# Patient Record
Sex: Male | Born: 1955 | Race: White | Hispanic: No | Marital: Married | Smoking: Former smoker
Health system: Southern US, Community
[De-identification: ages and names within clinical notes are randomized; demographics above are authoritative.]

## PROBLEM LIST (undated history)

## (undated) DIAGNOSIS — F410 Panic disorder [episodic paroxysmal anxiety] without agoraphobia: Secondary | ICD-10-CM

## (undated) DIAGNOSIS — A6920 Lyme disease, unspecified: Secondary | ICD-10-CM

## (undated) DIAGNOSIS — K4030 Unilateral inguinal hernia, with obstruction, without gangrene, not specified as recurrent: Secondary | ICD-10-CM

## (undated) DIAGNOSIS — J45909 Unspecified asthma, uncomplicated: Secondary | ICD-10-CM

## (undated) DIAGNOSIS — J449 Chronic obstructive pulmonary disease, unspecified: Secondary | ICD-10-CM

## (undated) DIAGNOSIS — T884XXA Failed or difficult intubation, initial encounter: Secondary | ICD-10-CM

## (undated) DIAGNOSIS — I509 Heart failure, unspecified: Secondary | ICD-10-CM

## (undated) DIAGNOSIS — E0590 Thyrotoxicosis, unspecified without thyrotoxic crisis or storm: Secondary | ICD-10-CM

## (undated) HISTORY — PX: CYST REMOVAL HAND: SHX6279

## (undated) HISTORY — PX: TONSILLECTOMY: SUR1361

---

## 1980-06-17 HISTORY — PX: HERNIA REPAIR: SHX51

## 1997-10-12 ENCOUNTER — Inpatient Hospital Stay (HOSPITAL_COMMUNITY): Admission: EM | Admit: 1997-10-12 | Discharge: 1997-10-15 | Payer: Self-pay | Admitting: Emergency Medicine

## 1997-10-17 ENCOUNTER — Ambulatory Visit (HOSPITAL_COMMUNITY): Admission: RE | Admit: 1997-10-17 | Discharge: 1997-10-17 | Payer: Self-pay | Admitting: Endocrinology

## 2001-05-26 ENCOUNTER — Encounter (INDEPENDENT_AMBULATORY_CARE_PROVIDER_SITE_OTHER): Payer: Self-pay | Admitting: *Deleted

## 2001-05-26 ENCOUNTER — Ambulatory Visit (HOSPITAL_COMMUNITY): Admission: RE | Admit: 2001-05-26 | Discharge: 2001-05-26 | Payer: Self-pay | Admitting: Gastroenterology

## 2003-09-16 ENCOUNTER — Emergency Department (HOSPITAL_COMMUNITY): Admission: EM | Admit: 2003-09-16 | Discharge: 2003-09-16 | Payer: Self-pay | Admitting: Emergency Medicine

## 2005-08-05 ENCOUNTER — Emergency Department (HOSPITAL_COMMUNITY): Admission: EM | Admit: 2005-08-05 | Discharge: 2005-08-05 | Payer: Self-pay | Admitting: Emergency Medicine

## 2005-10-08 ENCOUNTER — Ambulatory Visit (HOSPITAL_BASED_OUTPATIENT_CLINIC_OR_DEPARTMENT_OTHER): Admission: RE | Admit: 2005-10-08 | Discharge: 2005-10-08 | Payer: Self-pay | Admitting: Orthopaedic Surgery

## 2005-10-08 ENCOUNTER — Encounter (INDEPENDENT_AMBULATORY_CARE_PROVIDER_SITE_OTHER): Payer: Self-pay | Admitting: *Deleted

## 2007-03-30 ENCOUNTER — Emergency Department (HOSPITAL_COMMUNITY): Admission: EM | Admit: 2007-03-30 | Discharge: 2007-03-30 | Payer: Self-pay | Admitting: Emergency Medicine

## 2007-06-18 HISTORY — PX: ROTATOR CUFF REPAIR: SHX139

## 2008-12-02 ENCOUNTER — Emergency Department (HOSPITAL_COMMUNITY): Admission: EM | Admit: 2008-12-02 | Discharge: 2008-12-02 | Payer: Self-pay | Admitting: Emergency Medicine

## 2008-12-19 ENCOUNTER — Emergency Department (HOSPITAL_COMMUNITY): Admission: EM | Admit: 2008-12-19 | Discharge: 2008-12-19 | Payer: Self-pay | Admitting: Emergency Medicine

## 2010-05-09 ENCOUNTER — Encounter: Admission: RE | Admit: 2010-05-09 | Discharge: 2010-05-09 | Payer: Self-pay | Admitting: Orthopedic Surgery

## 2010-06-17 DIAGNOSIS — T884XXA Failed or difficult intubation, initial encounter: Secondary | ICD-10-CM

## 2010-06-17 HISTORY — DX: Failed or difficult intubation, initial encounter: T88.4XXA

## 2010-09-23 LAB — CBC
HCT: 47.6 % (ref 39.0–52.0)
Hemoglobin: 15.8 g/dL (ref 13.0–17.0)
MCHC: 33.1 g/dL (ref 30.0–36.0)
MCV: 92.9 fL (ref 78.0–100.0)
RBC: 5.12 MIL/uL (ref 4.22–5.81)
WBC: 7.3 10*3/uL (ref 4.0–10.5)

## 2010-09-23 LAB — COMPREHENSIVE METABOLIC PANEL
AST: 26 U/L (ref 0–37)
BUN: 6 mg/dL (ref 6–23)
CO2: 25 mEq/L (ref 19–32)
Calcium: 9.5 mg/dL (ref 8.4–10.5)
Chloride: 110 mEq/L (ref 96–112)
Creatinine, Ser: 0.85 mg/dL (ref 0.4–1.5)
GFR calc Af Amer: >60 mL/min (ref >60)
GFR calc non Af Amer: >60 mL/min (ref >60)
Glucose, Bld: 94 mg/dL (ref 70–99)
Total Bilirubin: 0.8 mg/dL (ref 0.3–1.2)

## 2010-09-23 LAB — DIFFERENTIAL
Basophils Absolute: 0 10*3/uL (ref 0.0–0.1)
Eosinophils Relative: 1 % (ref 0–5)
Lymphocytes Relative: 32 % (ref 12–46)
Lymphs Abs: 2.4 10*3/uL (ref 0.7–4.0)
Neutrophils Relative %: 58 % (ref 43–77)

## 2010-09-23 LAB — HEMOCCULT GUIAC POC 1CARD (OFFICE): Fecal Occult Bld: NEGATIVE

## 2010-09-24 LAB — COMPREHENSIVE METABOLIC PANEL
ALT: 23 U/L (ref 0–53)
Albumin: 4.2 g/dL (ref 3.5–5.2)
Alkaline Phosphatase: 44 U/L (ref 39–117)
Chloride: 107 mEq/L (ref 96–112)
Potassium: 3.7 mEq/L (ref 3.5–5.1)
Sodium: 139 mEq/L (ref 135–145)
Total Bilirubin: 1 mg/dL (ref 0.3–1.2)
Total Protein: 7.5 g/dL (ref 6.0–8.3)

## 2010-09-24 LAB — CBC
Hemoglobin: 15.7 g/dL (ref 13.0–17.0)
Platelets: 284 10*3/uL (ref 150–400)
RDW: 13.2 % (ref 11.5–15.5)
WBC: 8 10*3/uL (ref 4.0–10.5)

## 2010-09-24 LAB — DIFFERENTIAL
Basophils Relative: 0 % (ref 0–1)
Eosinophils Absolute: 0.1 10*3/uL (ref 0.0–0.7)
Eosinophils Relative: 2 % (ref 0–5)
Monocytes Absolute: 0.7 10*3/uL (ref 0.1–1.0)
Monocytes Relative: 8 % (ref 3–12)

## 2010-10-30 NOTE — Consult Note (Signed)
Michael Love, TRAUTMAN         ACCOUNT NO.:  [id number]   MEDICAL RECORD NO.:  [id number]          PATIENT TYPE:  EMS   LOCATION:  ED                           FACILITY:  [facility]   PHYSICIAN:  Corinna L. Lendell Caprice, MDDATE OF BIRTH:  15-Jul-1955   DATE OF CONSULTATION:  12/02/2008  DATE OF DISCHARGE:  12/02/2008                                 CONSULTATION   REASON FOR CONSULTATION:  Reported black stool and abdominal pain.   HISTORY OF PRESENT ILLNESS:  Michael Love is a 55 year old white male  who has had black tarry stools for the past few days on and off.  He  also has had epigastric pain, particularly after eating.  He has been  nauseated.  He has been able to keep down liquids but has not been  eating as much as usual.  He has a history of gastroesophageal reflux  disease and has been seen by Dr. Randa Evens previously and had an EGD and  colonoscopy.  He has a does not drink alcohol.  He has not been taking  NSAIDs.  He has had no fevers or chills.  No weight loss.  He had no  stool in the vault and was heme negative on exam in the office.  He was  sent to the emergency room for further workup.  The patient requests to  go home if able.   PAST MEDICAL HISTORY:  Gastroesophageal reflux disease.   SOCIAL HISTORY:  The patient does not drink, smoke or use drugs.   FAMILY HISTORY:  His mother had heart disease.   REVIEW OF SYSTEMS:  As above, otherwise negative.   PHYSICAL EXAMINATION:  Temperature is 98.1, blood pressure 132/83, pulse  75, respiratory rate 20, oxygen saturation 97% on room air.  GENERAL:  Patient is well-nourished, well-developed in no acute  distress.  HEENT:  Normocephalic/atraumatic.  Pupils equal, round, reactive to  light.  Sclerae nonicteric.  Moist mucous membranes.  NECK:  Supple, no lymphadenopathy.  LUNGS:  Clear to auscultation bilaterally without wheezes, rhonchi or  rales.  CARDIOVASCULAR:  Regular rate and rhythm without murmurs, gallops or  rubs.  ABDOMEN:  Normal bowel sounds, soft, nontender, nondistended.  GU:  Deferred.  RECTAL:  As above.  EXTREMITIES:  No clubbing, cyanosis or edema.  SKIN:  No rash or jaundice.   LABS:  CBC is completely normal, specifically hemoglobin of 15.  PT/PTT  normal.  Complete metabolic panel is normal, specifically BUN is 7.  Acute abdominal series negative.   IMPRESSIONS/RECOMMENDATIONS:  1. Reported black tarry stool, heme negative today and no anemia or      hypotension.  Because he has had postprandial pain I will check a      lipase but if that is normal he can go home with Prilosec 40 mg a      day, Phenergan as needed and Vicodin.  I have spoken with Dr. Evette Cristal      who agrees and the patient can follow up with Dr. Randa Evens and have      esophagogastroduodenoscopy or other next week.  He that is stable  and does not      need to be admitted unless he has pancreatitis.  Apparently he has      had some similar episodes in the past.  2. Nausea, abdominal pain, see above; possibly esophagitis, possibly      gastritis or biliary etiology:  He had an abdominal ultrasound in      2005 and that showed no gallstones.      Corinna L. Lendell Caprice, MD  Electronically Signed     CLS/MEDQ  D:  12/02/2008  T:  12/03/2008  Job:  [id number]

## 2010-11-02 NOTE — Procedures (Signed)
[facility]  Patient:    Michael Love, Michael Love Visit Number: [id number] MRN: [id number]          Service Type: END Location: ENDO Attending Physician:  Orland Mustard Dictated by:   Llana Aliment. Randa Evens, M.D. Proc. Date: 05/26/01 Admit Date:  05/26/2001   CC:         Jamesetta Geralds, M.D.   Procedure Report  PROCEDURE:  Colonoscopy and polypectomy.  SURGEON:  James L. Edwards, M.D.  MEDICATIONS:  Fentanyl 100 mcg and Versed 10 mg IV.  SCOPE:  Olympus adult video colonoscope.  INDICATIONS:  Heme positive stool.  DESCRIPTION OF PROCEDURE:  Following endoscopy, the patient was turned around, and digital was performed.  The Olympus adult video colonoscope was inserted and advanced under direct visualization.  We advanced the scope up to the cecum using abdominal pressure and position changes.  The ileocecal valve and appendiceal orifice seen.  The scope was withdrawn and the cecum, ascending colon, hepatic flexure, transverse colon, splenic flexure, and descending colon were seen well, and no significant diverticular disease.  In the sigmoid colon were three-quarter centimeters semi-pedunculated polyp was seen at 30 cm.  It was snared and sucked through the scope.  There was no obvious bleeding at the site.  No other polyps were seen in the sigmoid colon and rectum.  The patient tolerated the procedure well.  The scope was withdrawn. He was maintained on low flow oxygen and pulse oximeter throughout the procedure.  ASSESSMENT:  Sigmoid colon polyp, removed.  PLAN: 1. Routine post polypectomy instructions.  Followup will depend on the    pathology. 2. Will see back in the office in January. Dictated by:   Llana Aliment. Randa Evens, M.D. Attending Physician:  Orland Mustard DD:  05/26/01 TD:  05/26/01 Job: [id number]

## 2010-11-02 NOTE — Op Note (Signed)
NAMEJIAN, HODGMAN NO.:  [id number]   MEDICAL RECORD NO.:  [id number]            PATIENT TYPE:   LOCATION:                                 FACILITY:   PHYSICIAN:  Lubertha Basque. Jerl Santos, M.D.     DATE OF BIRTH:   DATE OF PROCEDURE:  10/08/2005  DATE OF DISCHARGE:                                 OPERATIVE REPORT   PREOP DIAGNOSIS:  Left index mucoid cyst.   POSTOP DIAGNOSIS:  Left index mucoid cyst.   PROCEDURES:  1.  Excision mucoid cyst, left index finger.  2.  Arthrotomy left distal interphalangeal joint with removal of osteophyte.   ANESTHESIA:  Block and MAC.   ATTENDING SURGEON:  Lubertha Basque. Jerl Santos, M.D.   ASSISTANT:  Lindwood Qua, P.A.   INDICATION FOR PROCEDURE:  The patient is a 55 year old male with long  history of an impressive cyst on the left index finger near the DIP joint.  This has become problematic, in that he hits it frequently; and was also  very painful.  He wished to have this removed.  By x-ray he had some  degenerative changes, certainly not end-stage change of the DIP joint. He  also had a small osteophyte in this area.  This cyst was so large that it  was starting to affect the fingernail, adjacent.  He is offered excision.  Informed operative consent was obtained after discussion of the possible  complications of reaction to anesthesia, infection, and recurrence.   DESCRIPTION OF PROCEDURE:  The patient went to the operating suite where  Bier block and MAC were applied.  He was positioned supine and prepped and  draped in a normal sterile fashion.  We did administer some preop IV Kefzol  prior to initiation of the Bier block anesthetic.  After sterile prep with  DuraPrep and draping, we then made a small incision over the DIP joint and  the cyst.  Dissection was carried down to this cyst which was excised.  This  was a gelatinous-filled cyst consistent with a mucoid cyst.  This cyst did  seem to track, actually under his  fingernail; and was erosive to the bone of  the distal phalanx.  The entire cyst was excised along with a stalk which  went down towards the bone.  I felt that this likely had to emanate in some  way from the joint, so I made an arthrotomy there and removed a small  osteophyte seen on OEC views which I took myself in the operating room.  The  wound was thoroughly irrigated.  The cyst was sent to pathology in formalin.  I then reapproximated the skin with vertical mattresses of nylon.  Some  Marcaine was injected to perform a digital block, and no epinephrine was  added.  The tourniquet was deflated and the fingertip became pink and warm  immediately.  We then placed some Adaptic and a dry gauze dressing with  loose Coban wrap.  Estimated blood loss and intraoperative fluids can be  obtained from anesthesia records, as can an accurate tourniquet time.   DISPOSITION:  The patient was taken  to the recovery room in stable addition.  Plans were for him to go home the same-day and followup in the office in  less than a week.  I will contact him by phone tonight.      Lubertha Basque Jerl Santos, M.D.  Electronically Signed     PGD/MEDQ  D:  10/08/2005  T:  10/09/2005  Job:  [id number]

## 2010-11-02 NOTE — Discharge Summary (Signed)
Michael Love, Michael Love         ACCOUNT NO.:  [id number]   MEDICAL RECORD NO.:  [id number]          PATIENT TYPE:  INP   LOCATION:  1825                         FACILITY:  [facility]   PHYSICIAN:  Corinna L. Lendell Caprice, MDDATE OF BIRTH:  Dec 17, 1955   DATE OF ADMISSION:  08/05/2005  DATE OF DISCHARGE:  08/06/2005                                 DISCHARGE SUMMARY   DISCHARGE DIAGNOSES:  1.  Acute bronchitis with bronchospasm.  2.  Suspected gastroesophageal reflux disease with reactive airway disease.   DISCHARGE MEDICATIONS:  1.  Avelox 400 mg p.o. daily for six more days.  2.  Albuterol MDI with spacer 2 puffs q.4h. p.r.n. wheeze.  3.  Protonix 40 mg a day.   He is to follow up with Dr. Tenny Craw in two to four weeks.   ACTIVITY:  No heavy exertion until he returns to work on August 12, 2005.   DIET:  Regular.   CONDITION:  Stable.   CONSULTATIONS:  None.   PROCEDURES:  None.   PERTINENT LABORATORY DATA:  Coagulation panel, D-dimer, CBC normal.  Basic  metabolic panel significant for a potassium of 3.4, otherwise unremarkable.  Hemoglobin A1c was 5.1.  Cardiac enzymes negative. Thyroid function test  negative. Urine drug screen positive for THC. Chest x-ray, two views showed  minimal chronic bronchitic changes.   HISTORY AND HOSPITAL COURSE:  Michael Love is a pleasant 55 year old  white male with a history of thyroiditis who presented to the emergency room  after being seen in the walk-in clinic with complaints of wheezing,  shortness of breath. He had had a cold for about a month including a cough  which was nonproductive.  He is complaining of a headache. He denies smoking  cigarettes but he does admit to occasional marijuana use as evidenced by his  urine drug screen.  Please see H&P for complete details.   He had normal vital signs and normal oxygen saturation but apparently was  tachypneic with a respiratory rate of 28 and he had some expiratory  wheezing. He also  reported some night time wheezing which has been fairly  chronic for  him and sometimes he wakes up short of breath at nighttime. The patient was  started on Avelox nebulizers.  His wheezing improved and he was feeling much  better. He was also started on Protonix which should be continued.  If he  continues to have nighttime wheezing, consider polysomnogram and/or  echocardiogram as an outpatient.      Corinna L. Lendell Caprice, MD  Electronically Signed     CLS/MEDQ  D:  08/06/2005  T:  08/06/2005  Job:  [id number]   cc:   C. Duane Lope, M.D.  Fax: [phone]

## 2010-11-02 NOTE — Procedures (Signed)
[facility]  Patient:    Michael Love, Michael Love Visit Number: [id number] MRN: [id number]          Service Type: END Location: ENDO Attending Physician:  Orland Mustard Dictated by:   Llana Aliment. Randa Evens, M.D. Proc. Date: 05/26/01 Admit Date:  05/26/2001   CC:         Jamesetta Geralds, M.D.   Procedure Report  PROCEDURE:  Esophagogastroduodenoscopy.  MEDICATIONS:  Cetacaine spray, fentanyl 62.5 mcg, Versed 8 mg IV.  INDICATION:  Heme-positive stool and upper abdominal pain.  He had been on a lot of Goodys and Advil and for this reason, endoscopy is performed.  DESCRIPTION OF PROCEDURE:  The procedure had been explained to the patient and consent obtained.  The patient in the left lateral decubitus position.  The Olympus video endoscope inserted blindly into the esophagus and advanced under direct visualization.  The stomach was entered, the pylorus identified and passed.  The duodenum, including the bulb and second portion, were seen well and were unremarkable.  The scope was withdrawn back into the stomach, and the duodenal bulb, pyloric channel were normal.  The antrum and body were normal with no ulceration.  The fundus and cardia were seen well in the retroflex view and were normal.  The scope was withdrawn, and the patient did have a hiatal hernia with widely patent GE junction of free reflux.  The distal and proximal esophagus were normal and no ulcerations.  The scope withdrawn.  The patient tolerated the procedure well maintained on low-flow oxygen and pulse oximeter throughout the procedure.  ASSESSMENT:  Probable gastroesophageal reflux disease.  PLAN:  We will continue the patient on Nexium.  Proceed with colonoscopy at this time. Dictated by:   Llana Aliment. Randa Evens, M.D. Attending Physician:  Orland Mustard DD:  05/26/01 TD:  05/26/01 Job: [id number]

## 2010-11-02 NOTE — H&P (Signed)
Michael Love, Michael Love         ACCOUNT NO.:  [id number]   MEDICAL RECORD NO.:  [id number]          PATIENT TYPE:  EMS   LOCATION:  MAJO                         FACILITY:  [facility]   PHYSICIAN:  Melissa L. Ladona Ridgel, MD  DATE OF BIRTH:  31-Aug-1955   DATE OF ADMISSION:  08/05/2005  DATE OF DISCHARGE:                                HISTORY & PHYSICAL   CHIEF COMPLAINT:  Shortness of breath.   PRIMARY CARE PHYSICIAN:  Dr. Tenny Craw.  He has not seen Dr. Tenny Craw in many years.   HISTORY OF PRESENT ILLNESS:  The patient is a 55 year old white male with a  past medical history significant for a diagnosis of reflux.  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  He  states these were diagnosed by Dr. Patrecia Pace, but he is on no current  medications and had been treated but then discontinued treatment .  He has a  history of pre-diabetes and previous history of a diagnosis of ALS but this  never really was confirmed.  Today, the patient presented to the [facility] with increased shortness of breath and wheezing which started  acutely at work.  The patient relates that for the patient couple of weeks  to months he has been waking up at night with shortness of breath and  wheezing.  He has to get up out of the bed and go to the couch, where he  props himself up and ultimately the symptoms resolve.  He states that the  symptoms are related usually with chest pressure that feels gripping in  nature.  He does not describe any nausea, vomiting or diaphoresis.  The  patient states that this has been happening at night, but he did not go to  the doctor with regard to this.  He states that today when his symptoms  started during the day, he was a little bit concerned and, therefore, he  went to the [facility].  In the [facility], he was found to be  wheezing excessively with inspiratory and expiratory wheezes.  He was  notably short of breath but his pulse ox  was 97%.  He was treated with a  nebulizer and sent to the emergency room for further evaluation.  In the  emergency room he was treated with nebulizers but continued to have some  chest tightness and wheezing.   REVIEW OF SYSTEMS:  Significant for upper respiratory infection in December  which went through his family.  He did describe with these symptoms  paroxysmal nocturnal dyspnea and wheezing with snoring.  He uses one pillow,  but when he gets these symptoms he has to prop himself up on the couch.  As  described, he had some chest pain that was gripping in nature, denied no  nausea or vomiting.  The patient did have a history of rectal bleeding that  was evaluated in 2002 with EGD and endoscopy.  The patient states recently  he has noted that when he cuts himself he bleeds longer than he thinks he  should and he has had some  easy bruising.  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  He has not had a  recurrence in quite some time.  The patient does describe some weight loss,  but he cannot quantify that.  He has felt achy but has had no fever or  chills.  All other review of systems are negative.   PAST MEDICAL HISTORY:  1.  He has been diagnosed in the past with GERD.  2.  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 Patrecia Pace briefly with medications and then no further      medications were needed.  3.  He has not had an influenza shot this year.  4.  He has a history of Bell's palsy.  5.  He has been diagnosed in the past as a prediabetic.  6.  His last colonoscopy was, he says, 4 years ago, but in the computer it      appears to be in 2002.  7.  He has not had any recurrent rectal bleeding, but he did have      polypectomy.   PAST SURGICAL HISTORY:  1.  Hiatal hernia repair.  2.  Polypectomy.   FAMILY  HISTORY:  Mom is living, she has diabetes, she recently had CABG x3  vessels.   SOCIAL HISTORY:  Denies tobacco, denies ethanol, and he works in  maintenance.  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   ALLERGIES:  NO KNOWN DRUG ALLERGY.   MEDICATIONS:  Recently took Mucinex x1 day and tried over-the-counter  medications.   VITAL SIGNS:  Temperature is 98.4, blood pressure 128/75, pulse is 82,  respirations 22, saturation is 96%.  Generally, he is in no acute distress.  He is normocephalic, atraumatic.  Pupils equal, round, reactive to light.  Extraocular muscles are intact.  Mucous membranes are moist.   NECK:  Is supple.  There is no JVD, no lymph nodes and no carotid bruits.  He does not appear to have any thyromegaly.  He does have a lesion on his  left chin which appears scabbed over and slightly erythematous but no pus is  noted.  The patient has no evidence for proptosis.  His hair is thick and  full without evidence for abnormal texture.  He has good dentition.   CHEST:  Has decreased air entry at the bases with expiratory wheezes but no  inspiratory wheezes.  He has no rhonchi.   CARDIOVASCULAR:  He has regular rate and rhythm, positive S1, S2.  No S3,  S4, no murmurs, rubs or gallops.   ABDOMEN:  Is soft with mild tenderness in the right upper quadrant but no  guarding or rebound.  He is nondistended.  EXTREMITIES:  Show no clubbing,  cyanosis or edema.   NEUROLOGIC:  He is awake, alert, oriented x3.  Cranial nerves II-XII are  intact.  Power is 5/5.  Deep tendon reflexes are 2+ without hyperreflexia,  he has no proptosis.   PERTINENT LABORATORY VALUES:  White count is 6.0, hemoglobin is 14.0,  hematocrit is 41.9, platelets are 270.  A sodium is 135, his potassium is  3.4, chloride is 106, CO2 is 23, BUN is 7, creatinine is 1.0.  His BUN is 7,  creatinine is 1.0, as stated.  His glucose is  109.  LFTs are within normal limits.  His EKG shows 81  breaths per minute, normal sinus rhythm, no ST T-  wave changes are noted.  His chest x-ray shows only bronchitic changes.   ASSESSMENT AND PLAN:  This is a 55 year old white male with a past medical  history of thyroiditis with periods of toxic storm.  He has not had any  episodes of this recently, he has a question of pre-diabetes, and a question  of gastroesophageal reflux disease.  The patient has not been following with  a primary care physician and presented to the emergency room tonight with  shortness of breath and wheezing which appears to be an acute on chronic  situation for him as he has had shortness of breath and wheezing namely at  night.  1.  Cardiovascular.  He has got paroxysmal nocturnal dyspnea with      questionable orthopnea, a question as to whether there is a cardiac      origin for this.  I will check cardiac markers and a D-dimer.  Will      treat him for GERD.  Will consider an echo, if appropriate, in the a.m.      If his D-dimer is positive, I will send him for CT of the chest to rule      out chronic PE.  2.  Pulmonary.  Paroxysms of shortness of breath with wheezing.  Worse this      weekend into today.  His chest x-ray shows bronchitic changes with no      obvious infiltrate.  He is currently responding to nebulizer treatments      with decreasing wheezing.  At this time I will hold steroids and start      him on at least Avelox orally.  Will check a D-dimer.  If this is      positive, I will check a CT to rule out pulmonary embolus, and will      continue his Xopenex treatments with a flutter valve.  3.  Gastrointestinal.  He has a history of gastroesophageal reflux disease.      Will treat him for reflux which may be exacerbating his nocturnal      dyspnea.  4.  Genitourinary.  He has no current complaints of dysuria.  Will check a      UBS.  5.  Hematologic.  The patient complains of easy bruising and bleeding and      seems to be excessive bleeding if  he cuts himself.  Will check at PT,      INR, PTT and check a D-dimer.  6.  A history of thyroiditis with no recent evaluation.  Will check a TSH,      T3, T4 and obtain old records.  7.  Lovenox for deep vein thrombosis; with recent bleeding reports will hold      off, if patient with protracted stay.  Will use SCDs.      Melissa L. Ladona Ridgel, MD  Electronically Signed     MLT/MEDQ  D:  08/05/2005  T:  08/06/2005  Job:  [id number]   cc:   Miguel Aschoff, M.D.  Fax: [phone]

## 2011-03-28 LAB — DIFFERENTIAL
Basophils Relative: 1
Monocytes Absolute: 0.7
Monocytes Relative: 8
Neutro Abs: 5.8

## 2011-03-28 LAB — BASIC METABOLIC PANEL
CO2: 24
Calcium: 8.9
Chloride: 104
GFR calc Af Amer: >60
Potassium: 3.2 — ABNORMAL LOW
Sodium: 138

## 2011-03-28 LAB — CBC
HCT: 42.7
Hemoglobin: 14
MCHC: 32.9
MCV: 89.4
RBC: 4.77

## 2011-05-19 ENCOUNTER — Inpatient Hospital Stay (HOSPITAL_COMMUNITY)
Admission: EM | Admit: 2011-05-19 | Discharge: 2011-05-22 | DRG: 352 | Disposition: A | Discharge status: Home / Self Care | Payer: 59 | Attending: General Surgery | Admitting: General Surgery

## 2011-05-19 ENCOUNTER — Encounter (HOSPITAL_COMMUNITY): Payer: Self-pay | Admitting: Emergency Medicine

## 2011-05-19 ENCOUNTER — Emergency Department (HOSPITAL_COMMUNITY): Payer: 59

## 2011-05-19 ENCOUNTER — Emergency Department (HOSPITAL_COMMUNITY)
Admission: EM | Admit: 2011-05-19 | Discharge: 2011-05-19 | Disposition: A | Discharge status: Home / Self Care | Payer: Self-pay

## 2011-05-19 DIAGNOSIS — R29810 Facial weakness: Secondary | ICD-10-CM | POA: Diagnosis not present

## 2011-05-19 DIAGNOSIS — K4030 Unilateral inguinal hernia, with obstruction, without gangrene, not specified as recurrent: Principal | ICD-10-CM

## 2011-05-19 DIAGNOSIS — R109 Unspecified abdominal pain: Secondary | ICD-10-CM

## 2011-05-19 DIAGNOSIS — K4090 Unilateral inguinal hernia, without obstruction or gangrene, not specified as recurrent: Secondary | ICD-10-CM

## 2011-05-19 DIAGNOSIS — I10 Essential (primary) hypertension: Secondary | ICD-10-CM | POA: Diagnosis present

## 2011-05-19 HISTORY — DX: Unilateral inguinal hernia, with obstruction, without gangrene, not specified as recurrent: K40.30

## 2011-05-19 HISTORY — DX: Thyrotoxicosis, unspecified without thyrotoxic crisis or storm: E05.90

## 2011-05-19 LAB — DIFFERENTIAL
Basophils Relative: 1 % (ref 0–1)
Eosinophils Absolute: 0.4 10*3/uL (ref 0.0–0.7)
Monocytes Absolute: 0.5 10*3/uL (ref 0.1–1.0)
Monocytes Relative: 7 % (ref 3–12)
Neutrophils Relative %: 46 % (ref 43–77)

## 2011-05-19 LAB — BASIC METABOLIC PANEL
CO2: 27 mEq/L (ref 19–32)
Calcium: 9.3 mg/dL (ref 8.4–10.5)
GFR calc non Af Amer: >90 mL/min (ref >90)
Potassium: 3.4 mEq/L — ABNORMAL LOW (ref 3.5–5.1)
Sodium: 141 mEq/L (ref 135–145)

## 2011-05-19 LAB — URINALYSIS, ROUTINE W REFLEX MICROSCOPIC
Glucose, UA: NEGATIVE mg/dL
Leukocytes, UA: NEGATIVE
Protein, ur: NEGATIVE mg/dL
Specific Gravity, Urine: 1.011 (ref 1.005–1.030)
Urobilinogen, UA: 0.2 mg/dL (ref 0.0–1.0)

## 2011-05-19 LAB — CBC
Hemoglobin: 14.1 g/dL (ref 13.0–17.0)
MCH: 31.2 pg (ref 26.0–34.0)
MCHC: 35.3 g/dL (ref 30.0–36.0)

## 2011-05-19 MED ORDER — HYDROMORPHONE HCL PF 2 MG/ML IJ SOLN
2.0000 mg | INTRAMUSCULAR | Status: DC | PRN
  Administered 2011-05-19 – 2011-05-22 (×8): 2 mg via INTRAVENOUS
  Filled 2011-05-19 (×8): qty 1

## 2011-05-19 MED ORDER — HYDROMORPHONE HCL PF 2 MG/ML IJ SOLN
INTRAMUSCULAR | Status: AC
  Administered 2011-05-19: 1 mg
  Filled 2011-05-19: qty 1

## 2011-05-19 MED ORDER — HYDROMORPHONE HCL PF 1 MG/ML IJ SOLN
1.0000 mg | Freq: Once | INTRAMUSCULAR | Status: AC
  Administered 2011-05-19: 1 mg via INTRAVENOUS

## 2011-05-19 MED ORDER — KCL IN DEXTROSE-NACL 20-5-0.45 MEQ/L-%-% IV SOLN
INTRAVENOUS | Status: DC
  Administered 2011-05-20 – 2011-05-22 (×4): via INTRAVENOUS
  Filled 2011-05-19 (×8): qty 1000

## 2011-05-19 MED ORDER — CEFAZOLIN SODIUM-DEXTROSE 2-3 GM-% IV SOLR: 2.0000 g | Freq: Once | INTRAVENOUS | Status: DC

## 2011-05-19 MED ORDER — HYDROMORPHONE HCL PF 2 MG/ML IJ SOLN
INTRAMUSCULAR | Status: AC
  Filled 2011-05-19: qty 1

## 2011-05-19 NOTE — ED Provider Notes (Signed)
History     CSN: [id number] Arrival date & time: 05/19/2011  5:04 PM   First MD Initiated Contact with Patient 05/19/11 1708      No chief complaint on file.   (Consider location/radiation/quality/duration/timing/severity/associated sxs/prior treatment) The history is provided by the patient.  Pt has been having pain in his groin for a few weeks.  Pt states the pain has increased gradually.  The pain increases with movement.  Pt went to a walk in clinic today and was told to come to the emergency room.  No vomiting , no fever.  The pain is moderate to severe.  Pt was told that he had a hernia after the exam today.  No past medical history on file.  Past Surgical History  Procedure Date  . Hernia repair     No family history on file.  History  Substance Use Topics  . Smoking status: Never Smoker   . Smokeless tobacco: Not on file  . Alcohol Use: No      Review of Systems  Gastrointestinal: Positive for constipation. Negative for vomiting and diarrhea.  All other systems reviewed and are negative.    Allergies  Review of patient's allergies indicates no known allergies.  Home Medications   Current Outpatient Rx  Name Route Sig Dispense Refill  . B COMPLEX PO TABS Oral Take 1 tablet by mouth daily.      . OMEGA-3 FATTY ACIDS 1000 MG PO CAPS Oral Take 2 g by mouth daily.      Carma Leaven M PLUS PO TABS Oral Take 1 tablet by mouth daily.        BP 142/79  Pulse 59  Temp(Src) 98 F (36.7 C) (Oral)  Resp 16  SpO2 99%  Physical Exam  Nursing note and vitals reviewed. Constitutional: He appears well-developed and well-nourished. No distress.  HENT:  Head: Normocephalic and atraumatic.  Right Ear: External ear normal.  Left Ear: External ear normal.  Eyes: Conjunctivae are normal. Right eye exhibits no discharge. Left eye exhibits no discharge. No scleral icterus.  Neck: Neck supple. No tracheal deviation present.  Cardiovascular: Normal rate, regular rhythm and  intact distal pulses.   Pulmonary/Chest: Effort normal and breath sounds normal. No stridor. No respiratory distress. He has no wheezes. He has no rales.  Abdominal: Soft. Bowel sounds are normal. He exhibits no distension. There is no tenderness. There is no rebound and no guarding. A hernia is present. Hernia confirmed positive in the right inguinal area.  Genitourinary:    Right testis shows tenderness. Right testis shows no swelling. Left testis shows no swelling and no tenderness.       Right inguinal hernia, tender to palpation, no erythema  Musculoskeletal: He exhibits no edema and no tenderness.  Neurological: He is alert. He has normal strength. No sensory deficit. Cranial nerve deficit:  no gross defecits noted. He exhibits normal muscle tone. He displays no seizure activity. Coordination normal.  Skin: Skin is warm and dry. No rash noted.  Psychiatric: He has a normal mood and affect.    ED Course  Procedures (including critical care time)  Labs Reviewed  BASIC METABOLIC PANEL - Abnormal; Notable for the following:    Potassium 3.4 (*)    Glucose, Bld 103 (*)    All other components within normal limits  CBC  DIFFERENTIAL  URINALYSIS, ROUTINE W REFLEX MICROSCOPIC   US Scrotum  05/19/2011  *RADIOLOGY REPORT*  Clinical Data: Scrotal/groin pain.  History of hernia repair.  SCROTAL ULTRASOUND DOPPLER ULTRASOUND OF THE TESTICLES  Technique:  Complete ultrasound examination of the testicles, epididymis, and other scrotal structures was performed.  Color and spectral Doppler ultrasound were also utilized to evaluate blood flow to the testicles.  Comparison:  None.  Findings:  The testicles are symmetric in size and echogenicity. The right testis measures 4.5 x 2.5 x 3.3 cm and the left testis measures 4.3 x 2.8 x 3.2 cm.  No testicular masses are seen, and there is no evidence of microlithiasis.  Both epididymal heads are unremarkable in appearance.  There are small bilateral  hydroceles.  There is no evidence of varicocele or other extra-testicular abnormality.  Blood flow is seen within both testicles on color Doppler sonography.  Doppler spectral waveforms show both arterial and venous flow signal in both testicles.  IMPRESSION: 1.  No evidence of testicular mass or torsion. 2.  Small bilateral hydroceles.  Original Report Authenticated By: Gerrianne Scale, M.D.   [place] Art/ven Flow Abd Pelv Doppler  05/19/2011  *RADIOLOGY REPORT*  Clinical Data: Scrotal/groin pain.  History of hernia repair.  SCROTAL ULTRASOUND DOPPLER ULTRASOUND OF THE TESTICLES  Technique:  Complete ultrasound examination of the testicles, epididymis, and other scrotal structures was performed.  Color and spectral Doppler ultrasound were also utilized to evaluate blood flow to the testicles.  Comparison:  None.  Findings:  The testicles are symmetric in size and echogenicity. The right testis measures 4.5 x 2.5 x 3.3 cm and the left testis measures 4.3 x 2.8 x 3.2 cm.  No testicular masses are seen, and there is no evidence of microlithiasis.  Both epididymal heads are unremarkable in appearance.  There are small bilateral hydroceles.  There is no evidence of varicocele or other extra-testicular abnormality.  Blood flow is seen within both testicles on color Doppler sonography.  Doppler spectral waveforms show both arterial and venous flow signal in both testicles.  IMPRESSION: 1.  No evidence of testicular mass or torsion. 2.  Small bilateral hydroceles.  Original Report Authenticated By: Gerrianne Scale, M.D.     MDM  7:55 PM patient without any significant signs of any testicular abnormalities on the ultrasound. The patient does have a hernia and I am not certain I can fully reduce it. It does not appear to be strangulated, although I am concerned about possible incarceration. I will consult general surgery  Diagnosis: Right inguinal hernia  8:57 PM Pt has been seen by Dr. Jamey Ripa. He plans on  admitting him to the hospital for treatment of his inguinal hernia.    Celene Kras, MD 05/19/11 [phone]

## 2011-05-19 NOTE — ED Notes (Signed)
Pt c/o right groin pain x "several weeks" with worsening of pain today and swelling to right groin, denies urinary symptoms

## 2011-05-19 NOTE — H&P (Signed)
Michael Love is an 55 y.o. male.   Chief Complaint: pain in right groin HPI: he presents to the [facility] long emergency department tonight with pain and a bulge in the right inguinal area. He has had some discomfort and pain in the area for several months.over the last several weeks he has been doing more lifting at work and has now over the last few days noticed a bulge in the inguinal area. This gotten gradually more tender. He has not had any nausea or vomiting. He's had no diarrhea or constipation. He voids okay.  However because of his ongoing pain in the ball she presented to the emergency department. He was seen by emergency department physician he was able to at least partially reduce a right inguinal hernia. He asked [place] to see him because he wasn't completely sure he had a completely reduced, and as soon as the patient would stand back up it would bulge back out again. He noticed it was quite tender as well.  Past Medical History  Diagnosis Date  . Incarcerated inguinal hernia 05/19/2011    Past Surgical History  Procedure Date  . Hernia repair     History reviewed. No pertinent family history. Social History:  reports that he has never smoked. He does not have any smokeless tobacco history on file. He reports that he does not drink alcohol. His drug history not on file.  Allergies: No Known Allergies  Medications Prior to Admission  Medication Dose Route Frequency Provider Last Rate Last Dose  . HYDROmorphone (DILAUDID) 2 MG/ML injection        1 mg at 05/19/11 1812  . HYDROmorphone (DILAUDID) 2 MG/ML injection           . HYDROmorphone (DILAUDID) injection 1 mg  1 mg Intravenous Once Celene Kras, MD   1 mg at 05/19/11 2037   No current outpatient prescriptions on file as of 05/19/2011.    Results for orders placed during the hospital encounter of 05/19/11 (from the past 48 hour(s))  CBC     Status: Normal   Collection Time   05/19/11  5:45 PM      Component Value  Range Comment   WBC 7.2  4.0 - 10.5 (K/uL)    RBC 4.52  4.22 - 5.81 (MIL/uL)    Hemoglobin 14.1  13.0 - 17.0 (g/dL)    HCT 16.1  09.6 - 04.5 (%)    MCV 88.3  78.0 - 100.0 (fL)    MCH 31.2  26.0 - 34.0 (pg)    MCHC 35.3  30.0 - 36.0 (g/dL)    RDW 40.9  81.1 - 91.4 (%)    Platelets 288  150 - 400 (K/uL)   DIFFERENTIAL     Status: Normal   Collection Time   05/19/11  5:45 PM      Component Value Range Comment   Neutrophils Relative 46  43 - 77 (%)    Neutro Abs 3.3  1.7 - 7.7 (K/uL)    Lymphocytes Relative 41  12 - 46 (%)    Lymphs Abs 3.0  0.7 - 4.0 (K/uL)    Monocytes Relative 7  3 - 12 (%)    Monocytes Absolute 0.5  0.1 - 1.0 (K/uL)    Eosinophils Relative 5  0 - 5 (%)    Eosinophils Absolute 0.4  0.0 - 0.7 (K/uL)    Basophils Relative 1  0 - 1 (%)    Basophils Absolute 0.1  0.0 -  0.1 (K/uL)   BASIC METABOLIC PANEL     Status: Abnormal   Collection Time   05/19/11  5:45 PM      Component Value Range Comment   Sodium 141  135 - 145 (mEq/L)    Potassium 3.4 (*) 3.5 - 5.1 (mEq/L)    Chloride 105  96 - 112 (mEq/L)    CO2 27  19 - 32 (mEq/L)    Glucose, Bld 103 (*) 70 - 99 (mg/dL)    BUN 9  6 - 23 (mg/dL)    Creatinine, Ser 4.09  0.50 - 1.35 (mg/dL)    Calcium 9.3  8.4 - 10.5 (mg/dL)    GFR calc non Af Amer >90  >90 (mL/min)    GFR calc Af Amer >90  >90 (mL/min)   URINALYSIS, ROUTINE W REFLEX MICROSCOPIC     Status: Normal   Collection Time   05/19/11  6:33 PM      Component Value Range Comment   Color, Urine YELLOW  YELLOW     APPearance CLEAR  CLEAR     Specific Gravity, Urine 1.011  1.005 - 1.030     pH 6.0  5.0 - 8.0     Glucose, UA NEGATIVE  NEGATIVE (mg/dL)    Hgb urine dipstick NEGATIVE  NEGATIVE     Bilirubin Urine NEGATIVE  NEGATIVE     Ketones, ur NEGATIVE  NEGATIVE (mg/dL)    Protein, ur NEGATIVE  NEGATIVE (mg/dL)    Urobilinogen, UA 0.2  0.0 - 1.0 (mg/dL)    Nitrite NEGATIVE  NEGATIVE     Leukocytes, UA NEGATIVE  NEGATIVE  MICROSCOPIC NOT DONE ON URINES WITH  NEGATIVE PROTEIN, BLOOD, LEUKOCYTES, NITRITE, OR GLUCOSE <1000 mg/dL.   US Scrotum  05/19/2011  *RADIOLOGY REPORT*  Clinical Data: Scrotal/groin pain.  History of hernia repair.  SCROTAL ULTRASOUND DOPPLER ULTRASOUND OF THE TESTICLES  Technique:  Complete ultrasound examination of the testicles, epididymis, and other scrotal structures was performed.  Color and spectral Doppler ultrasound were also utilized to evaluate blood flow to the testicles.  Comparison:  None.  Findings:  The testicles are symmetric in size and echogenicity. The right testis measures 4.5 x 2.5 x 3.3 cm and the left testis measures 4.3 x 2.8 x 3.2 cm.  No testicular masses are seen, and there is no evidence of microlithiasis.  Both epididymal heads are unremarkable in appearance.  There are small bilateral hydroceles.  There is no evidence of varicocele or other extra-testicular abnormality.  Blood flow is seen within both testicles on color Doppler sonography.  Doppler spectral waveforms show both arterial and venous flow signal in both testicles.  IMPRESSION: 1.  No evidence of testicular mass or torsion. 2.  Small bilateral hydroceles.  Original Report Authenticated By: Gerrianne Scale, M.D.   [place] Art/ven Flow Abd Pelv Doppler  05/19/2011  *RADIOLOGY REPORT*  Clinical Data: Scrotal/groin pain.  History of hernia repair.  SCROTAL ULTRASOUND DOPPLER ULTRASOUND OF THE TESTICLES  Technique:  Complete ultrasound examination of the testicles, epididymis, and other scrotal structures was performed.  Color and spectral Doppler ultrasound were also utilized to evaluate blood flow to the testicles.  Comparison:  None.  Findings:  The testicles are symmetric in size and echogenicity. The right testis measures 4.5 x 2.5 x 3.3 cm and the left testis measures 4.3 x 2.8 x 3.2 cm.  No testicular masses are seen, and there is no evidence of microlithiasis.  Both epididymal heads are unremarkable in appearance.  There are small bilateral hydroceles.   There is no evidence of varicocele or other extra-testicular abnormality.  Blood flow is seen within both testicles on color Doppler sonography.  Doppler spectral waveforms show both arterial and venous flow signal in both testicles.  IMPRESSION: 1.  No evidence of testicular mass or torsion. 2.  Small bilateral hydroceles.  Original Report Authenticated By: Gerrianne Scale, M.D.    Review of Systems  Constitutional: Negative for fever, chills, weight loss and malaise/fatigue.  HENT: Negative for hearing loss.   Eyes: Negative for blurred vision.  Respiratory: Negative for cough and hemoptysis.   Cardiovascular: Negative for chest pain, palpitations, orthopnea and leg swelling.  Gastrointestinal: Negative for heartburn, nausea, vomiting, abdominal pain and diarrhea.  Genitourinary: Negative for dysuria, urgency and frequency.  Musculoskeletal: Negative for myalgias.  Skin: Negative for rash.  Neurological: Negative for dizziness, tingling and headaches.  Endo/Heme/Allergies: Does not bruise/bleed easily.  Psychiatric/Behavioral: Negative for depression and substance abuse.    Blood pressure 142/79, pulse 59, temperature 98 F (36.7 C), temperature source Oral, resp. rate 16, SpO2 99.00%. Physical Exam  Constitutional: He is oriented to person, place, and time. He appears well-developed and well-nourished. No distress.  HENT:  Head: Normocephalic and atraumatic.  Mouth/Throat: Oropharynx is clear and moist.  Eyes: Pupils are equal, round, and reactive to light. Left eye exhibits no discharge. No scleral icterus.  Neck: Normal range of motion. Neck supple. No tracheal deviation present. No thyromegaly present.  Cardiovascular: Normal rate, regular rhythm, normal heart sounds and intact distal pulses.  Exam reveals no gallop and no friction rub.   No murmur heard. Respiratory: Effort normal and breath sounds normal. No respiratory distress. He has no wheezes. He has no rales.  GI: Soft.  Bowel sounds are normal. He exhibits no distension and no mass. There is no tenderness. There is no rebound and no guarding.       He has a tender, partially reducible RIH. At one point I thought I had completely reduced it, but it immediately returned as soon as he strained the slightest bit. There is no suggestion of compromise to the intestine, as it is soft and pliable  Genitourinary: Prostate normal and penis normal.  Musculoskeletal: He exhibits no edema and no tenderness.  Lymphadenopathy:    He has no cervical adenopathy.  Neurological: He is alert and oriented to person, place, and time.  Skin: Skin is warm and dry. No rash noted. He is not diaphoretic. No erythema. No pallor.  Psychiatric: He has a normal mood and affect. His behavior is normal. Judgment and thought content normal.     Assessment/Plan Incarcerated, now at least partially reduced RIH, likely indirect  I have suggested that we keep him tonight and add him to the OR schedule tomorrow for repair. He is still having pain, and I don't think it will wait for several days for a more elective repair. I have discussed the plans with him and explained the surgery, but he may be a candidate for a laparoscopic repair so will need some more discussions in the am  Xavia Kniskern J 05/19/2011, 8:48 PM

## 2011-05-19 NOTE — ED Notes (Signed)
Will obtain Chest xray prior to pt being transported to floor 1523 per request of Marylene Land RN

## 2011-05-19 NOTE — ED Notes (Signed)
Patient transported to Ultrasound via stretcher with tech

## 2011-05-19 NOTE — ED Notes (Signed)
Pt currently in ultrasound upon shift change report will assess as soon as pt returns,  Reported pt to be stable upon transport to ultrasound

## 2011-05-20 ENCOUNTER — Other Ambulatory Visit: Payer: Self-pay

## 2011-05-20 DIAGNOSIS — K4030 Unilateral inguinal hernia, with obstruction, without gangrene, not specified as recurrent: Secondary | ICD-10-CM

## 2011-05-20 LAB — SURGICAL PCR SCREEN: Staphylococcus aureus: NEGATIVE

## 2011-05-20 MED ORDER — CEFAZOLIN SODIUM-DEXTROSE 2-3 GM-% IV SOLR
2.0000 g | Freq: Once | INTRAVENOUS | Status: AC
  Administered 2011-05-21: 2 g via INTRAVENOUS
  Filled 2011-05-20: qty 50

## 2011-05-20 NOTE — Interval H&P Note (Signed)
History and Physical Interval Note:  05/20/2011 1:20 PM  Michael Love  has presented today for surgery, with the diagnosis of incarcerated RIH.  The various methods of treatment have been discussed with the patient and family. After consideration of risks, benefits and other options for treatment, the patient has consented to  Procedure(s): HERNIA REPAIR INGUINAL ADULT as a surgical intervention .  The patients' history has been reviewed, patient examined, no change in status, stable for surgery.  I have reviewed the patients' chart and labs.  Questions were answered to the patient's satisfaction.     Donnetta Gillin

## 2011-05-20 NOTE — Progress Notes (Signed)
Patient ID: Michael Love, male   DOB: [birth year], 55 y.o.   MRN: [id number] Pt seen today.  No new complaints.  Ready for OR today.  Will proceed with surgical intervention this afternoon.  Barnetta Chapel, PA-C 05-20-11, 10:54am

## 2011-05-21 ENCOUNTER — Encounter (HOSPITAL_COMMUNITY): Payer: Self-pay | Admitting: Certified Registered Nurse Anesthetist

## 2011-05-21 ENCOUNTER — Other Ambulatory Visit: Payer: Self-pay

## 2011-05-21 ENCOUNTER — Inpatient Hospital Stay (HOSPITAL_COMMUNITY): Payer: 59

## 2011-05-21 ENCOUNTER — Inpatient Hospital Stay (HOSPITAL_COMMUNITY): Payer: 59 | Admitting: Certified Registered Nurse Anesthetist

## 2011-05-21 ENCOUNTER — Encounter (HOSPITAL_COMMUNITY): Admission: EM | Disposition: A | Discharge status: Home / Self Care | Payer: Self-pay | Source: Home / Self Care

## 2011-05-21 HISTORY — PX: INGUINAL HERNIA REPAIR: SHX194

## 2011-05-21 SURGERY — REPAIR, HERNIA, INGUINAL, ADULT
Anesthesia: General | Site: Abdomen | Laterality: Right | Wound class: Clean

## 2011-05-21 MED ORDER — ONDANSETRON HCL 4 MG/2ML IJ SOLN
INTRAMUSCULAR | Status: DC | PRN
  Administered 2011-05-21: 4 mg via INTRAVENOUS

## 2011-05-21 MED ORDER — LACTATED RINGERS IV SOLN
INTRAVENOUS | Status: DC | PRN
  Administered 2011-05-21 (×2): via INTRAVENOUS

## 2011-05-21 MED ORDER — ONDANSETRON HCL 4 MG PO TABS: 4.0000 mg | ORAL_TABLET | Freq: Four times a day (QID) | ORAL | Status: DC | PRN

## 2011-05-21 MED ORDER — METOPROLOL TARTRATE 1 MG/ML IV SOLN
5.0000 mg | Freq: Four times a day (QID) | INTRAVENOUS | Status: DC
  Administered 2011-05-21: 5 mg via INTRAVENOUS
  Filled 2011-05-21 (×3): qty 5

## 2011-05-21 MED ORDER — ENOXAPARIN SODIUM 40 MG/0.4ML ~~LOC~~ SOLN
40.0000 mg | SUBCUTANEOUS | Status: DC
  Filled 2011-05-21: qty 0.4

## 2011-05-21 MED ORDER — KETOROLAC TROMETHAMINE 15 MG/ML IJ SOLN: 15.0000 mg | Freq: Four times a day (QID) | INTRAMUSCULAR | Status: DC | PRN

## 2011-05-21 MED ORDER — HYDROCODONE-ACETAMINOPHEN 5-325 MG PO TABS: 1.0000 | ORAL_TABLET | ORAL | Status: AC | PRN

## 2011-05-21 MED ORDER — HYDROCODONE-ACETAMINOPHEN 5-325 MG PO TABS
1.0000 | ORAL_TABLET | ORAL | Status: DC | PRN
  Administered 2011-05-21 – 2011-05-22 (×3): 1 via ORAL
  Filled 2011-05-21 (×3): qty 1

## 2011-05-21 MED ORDER — PROMETHAZINE HCL 25 MG/ML IJ SOLN: 6.2500 mg | INTRAMUSCULAR | Status: DC | PRN

## 2011-05-21 MED ORDER — BUPIVACAINE LIPOSOME 1.3 % IJ SUSP
INTRAMUSCULAR | Status: DC | PRN
  Administered 2011-05-21: 20 mL

## 2011-05-21 MED ORDER — BUPIVACAINE LIPOSOME 1.3 % IJ SUSP
20.0000 mL | INTRAMUSCULAR | Status: DC
  Filled 2011-05-21: qty 20

## 2011-05-21 MED ORDER — NEOSTIGMINE METHYLSULFATE 1 MG/ML IJ SOLN
INTRAMUSCULAR | Status: DC | PRN
  Administered 2011-05-21: 5 mg via INTRAVENOUS

## 2011-05-21 MED ORDER — DEXAMETHASONE SODIUM PHOSPHATE 10 MG/ML IJ SOLN
INTRAMUSCULAR | Status: DC | PRN
  Administered 2011-05-21: 10 mg via INTRAVENOUS

## 2011-05-21 MED ORDER — ROCURONIUM BROMIDE 100 MG/10ML IV SOLN
INTRAVENOUS | Status: DC | PRN
  Administered 2011-05-21: 10 mg via INTRAVENOUS
  Administered 2011-05-21: 40 mg via INTRAVENOUS

## 2011-05-21 MED ORDER — NALOXONE HCL 0.4 MG/ML IJ SOLN
INTRAMUSCULAR | Status: AC
  Administered 2011-05-21: 0.4 mg
  Filled 2011-05-21: qty 1

## 2011-05-21 MED ORDER — ACETAMINOPHEN 10 MG/ML IV SOLN
INTRAVENOUS | Status: DC | PRN
  Administered 2011-05-21: 1000 mg via INTRAVENOUS

## 2011-05-21 MED ORDER — GADOBENATE DIMEGLUMINE 529 MG/ML IV SOLN
20.0000 mL | Freq: Once | INTRAVENOUS | Status: AC | PRN
  Administered 2011-05-21: 20 mL via INTRAVENOUS

## 2011-05-21 MED ORDER — LACTATED RINGERS IV SOLN: INTRAVENOUS | Status: DC

## 2011-05-21 MED ORDER — FENTANYL CITRATE 0.05 MG/ML IJ SOLN
25.0000 ug | INTRAMUSCULAR | Status: DC | PRN
  Administered 2011-05-21 (×2): 25 ug via INTRAVENOUS
  Administered 2011-05-21: 50 ug via INTRAVENOUS

## 2011-05-21 MED ORDER — PROPOFOL 10 MG/ML IV EMUL
INTRAVENOUS | Status: DC | PRN
  Administered 2011-05-21: 200 mg via INTRAVENOUS
  Administered 2011-05-21: 40 mg via INTRAVENOUS

## 2011-05-21 MED ORDER — GLYCOPYRROLATE 0.2 MG/ML IJ SOLN
INTRAMUSCULAR | Status: DC | PRN
  Administered 2011-05-21: .8 mg via INTRAVENOUS

## 2011-05-21 MED ORDER — METOPROLOL TARTRATE 1 MG/ML IV SOLN
5.0000 mg | Freq: Four times a day (QID) | INTRAVENOUS | Status: DC | PRN
  Filled 2011-05-21: qty 5

## 2011-05-21 MED ORDER — SUCCINYLCHOLINE CHLORIDE 20 MG/ML IJ SOLN
INTRAMUSCULAR | Status: DC | PRN
  Administered 2011-05-21: 100 mg via INTRAVENOUS

## 2011-05-21 MED ORDER — MORPHINE SULFATE 2 MG/ML IJ SOLN: 2.0000 mg | INTRAMUSCULAR | Status: DC | PRN

## 2011-05-21 MED ORDER — ONDANSETRON HCL 4 MG/2ML IJ SOLN
4.0000 mg | Freq: Four times a day (QID) | INTRAMUSCULAR | Status: DC | PRN
  Administered 2011-05-21: 4 mg via INTRAVENOUS
  Filled 2011-05-21: qty 2

## 2011-05-21 MED ORDER — LIDOCAINE HCL (CARDIAC) 20 MG/ML IV SOLN
INTRAVENOUS | Status: DC | PRN
  Administered 2011-05-21: 70 mg via INTRAVENOUS

## 2011-05-21 MED ORDER — MIDAZOLAM HCL 5 MG/5ML IJ SOLN
INTRAMUSCULAR | Status: DC | PRN
  Administered 2011-05-21: 2 mg via INTRAVENOUS

## 2011-05-21 MED ORDER — FENTANYL CITRATE 0.05 MG/ML IJ SOLN
INTRAMUSCULAR | Status: DC | PRN
  Administered 2011-05-21 (×5): 50 ug via INTRAVENOUS

## 2011-05-21 MED ORDER — KETOROLAC TROMETHAMINE 15 MG/ML IJ SOLN
15.0000 mg | Freq: Four times a day (QID) | INTRAMUSCULAR | Status: AC
  Administered 2011-05-21: 15 mg via INTRAVENOUS
  Filled 2011-05-21: qty 1

## 2011-05-21 SURGICAL SUPPLY — 46 items
ADH SKN CLS APL DERMABOND .7 (GAUZE/BANDAGES/DRESSINGS) ×2
APL SKNCLS STERI-STRIP NONHPOA (GAUZE/BANDAGES/DRESSINGS)
BENZOIN TINCTURE PRP APPL 2/3 (GAUZE/BANDAGES/DRESSINGS) IMPLANT
BLADE HEX COATED 2.75 (ELECTRODE) ×2 IMPLANT
BLADE SURG 15 STRL LF DISP TIS (BLADE) ×1 IMPLANT
BLADE SURG 15 STRL SS (BLADE) ×2
CANISTER SUCTION 2500CC (MISCELLANEOUS) ×2 IMPLANT
CLOTH BEACON ORANGE TIMEOUT ST (SAFETY) ×2 IMPLANT
DECANTER SPIKE VIAL GLASS SM (MISCELLANEOUS) ×2 IMPLANT
DERMABOND ADVANCED (GAUZE/BANDAGES/DRESSINGS) ×2
DERMABOND ADVANCED .7 DNX12 (GAUZE/BANDAGES/DRESSINGS) IMPLANT
DISSECTOR ROUND CHERRY 3/8 STR (MISCELLANEOUS) ×2 IMPLANT
DRAIN PENROSE 18X1/2 LTX STRL (DRAIN) IMPLANT
DRAPE LAPAROSCOPIC ABDOMINAL (DRAPES) ×2 IMPLANT
ELECT REM PT RETURN 9FT ADLT (ELECTROSURGICAL) ×2
ELECTRODE REM PT RTRN 9FT ADLT (ELECTROSURGICAL) ×1 IMPLANT
GLOVE BIO SURGEON STRL SZ 6 (GLOVE) ×4 IMPLANT
GLOVE BIOGEL PI IND STRL 6.5 (GLOVE) ×1 IMPLANT
GLOVE BIOGEL PI IND STRL 7.0 (GLOVE) ×1 IMPLANT
GLOVE BIOGEL PI INDICATOR 6.5 (GLOVE) ×1
GLOVE BIOGEL PI INDICATOR 7.0 (GLOVE) ×1
GLOVE INDICATOR 6.5 STRL GRN (GLOVE) ×4 IMPLANT
GOWN PREVENTION PLUS XXLARGE (GOWN DISPOSABLE) ×2 IMPLANT
KIT BASIN OR (CUSTOM PROCEDURE TRAY) ×2 IMPLANT
MESH PARIETEX PROGRIP RIGHT (Mesh General) ×1 IMPLANT
NDL HYPO 25X1 1.5 SAFETY (NEEDLE) ×1 IMPLANT
NEEDLE HYPO 25X1 1.5 SAFETY (NEEDLE) ×2 IMPLANT
NS IRRIG 1000ML POUR BTL (IV SOLUTION) ×2 IMPLANT
PACK BASIC VI WITH GOWN DISP (CUSTOM PROCEDURE TRAY) ×2 IMPLANT
PENCIL BUTTON HOLSTER BLD 10FT (ELECTRODE) ×2 IMPLANT
SPONGE GAUZE 4X4 12PLY (GAUZE/BANDAGES/DRESSINGS) ×2 IMPLANT
SPONGE LAP 18X18 X RAY DECT (DISPOSABLE) ×2 IMPLANT
STRIP CLOSURE SKIN 1/2X4 (GAUZE/BANDAGES/DRESSINGS) IMPLANT
SUT MNCRL AB 4-0 PS2 18 (SUTURE) ×2 IMPLANT
SUT PROLENE 2 0 SH DA (SUTURE) ×4 IMPLANT
SUT SILK 2 0 SH (SUTURE) IMPLANT
SUT SILK 3 0 (SUTURE)
SUT SILK 3-0 18XBRD TIE 12 (SUTURE) IMPLANT
SUT VIC AB 2-0 SH 27 (SUTURE) ×2
SUT VIC AB 2-0 SH 27X BRD (SUTURE) ×1 IMPLANT
SUT VIC AB 3-0 SH 27 (SUTURE) ×4
SUT VIC AB 3-0 SH 27XBRD (SUTURE) ×2 IMPLANT
SYR BULB IRRIGATION 50ML (SYRINGE) ×2 IMPLANT
SYR CONTROL 10ML LL (SYRINGE) ×2 IMPLANT
TOWEL OR 17X26 10 PK STRL BLUE (TOWEL DISPOSABLE) ×2 IMPLANT
YANKAUER SUCT BULB TIP 10FT TU (MISCELLANEOUS) ×2 IMPLANT

## 2011-05-21 NOTE — Consult Note (Signed)
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 Falls admitted with incarcerated the right internal hernia and underwent surgery today for Dr. Jearld Love He recovered well initially postop but this afternoon was found to initially complained of feeling feverish followed by tremulousness with altered consciousness and elevated blood pressure.Marland Kitchen He was noted as having some left facial asymmetry and tremulousness. He was given Narcan 1 mg and he improved significantly after that. The patient   does not have recollection of most of the events from this morning and history is obtained from his wife who was present at the bedside. Patient had been receiving a lot of pain medication since last night. He received 3 dozes of Dilaudid last night and 100 mg of fentanyl this morning. He also got 2 tablets of Vicodin at 12:30 this afternoon and previously received 1 dose of Toradol 15 mg. Patient states is felt quite well this evening and has returned back to his baseline. He has a remote history of Bell's palsy in 1984 and has mild residual left facial weakness as well as twitchings. He  Is denying any significant extremity weakness during today's episode. He initially had a CT scan of the head which showed no active normality there was a questionable hypodensity in the left insular region. MRI scan of the brain with and without contrast shows no active abnormality and is within normal limits. HPI:    Past Medical History  Diagnosis Date  . Incarcerated inguinal hernia 05/19/2011  . Hyperthyroidism 20 years    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 did not need surgery or treatment, pt has not had any medication for this condition since then    Past Surgical History  Procedure Date  . Hernia repair 1982    repaired hernia in upper abd  . Rotator cuff repair 2009    Dr Michael Love    Family History  Problem Relation Age of Onset  .  Cancer Father   . Cancer Brother     Social History:  reports that he has never smoked. He does not have any smokeless tobacco history on file. He reports that he does not drink alcohol or use illicit drugs.  Allergies: No Known Allergies  Medications: I have reviewed the patient's current medications.  Results for orders placed during the hospital encounter of 05/19/11 (from the past 48 hour(s))  SURGICAL PCR SCREEN     Status: Normal   Collection Time   05/20/11  2:53 AM      Component Value Range Comment   MRSA, PCR NEGATIVE  NEGATIVE     Staphylococcus aureus NEGATIVE  NEGATIVE    GLUCOSE, CAPILLARY     Status: Abnormal   Collection Time   05/21/11  3:40 PM      Component Value Range Comment   Glucose-Capillary 150 (*) 70 - 99 (mg/dL)     Dg Chest 2 View  16/06/958  *RADIOLOGY REPORT*  Clinical Data: Preoperative respiratory examination.  No chest complaints.  CHEST - 2 VIEW  Comparison: Radiographs 03/30/2007.  Findings: The heart size and mediastinal contours are stable. There are slightly lower lung volumes.  Mild chronic central airway thickening is present without air space disease, edema or pleural effusion.  Osseous structures appear unchanged.  IMPRESSION: Mild chronic central airway thickening.  No acute cardiopulmonary process.  Original Report Authenticated By: Michael Love, M.D.   Ct Head Wo Contrast  05/21/2011  *  RADIOLOGY REPORT*  Clinical Data: Rule out CVA, postop hernia repair  CT HEAD WITHOUT CONTRAST  Technique:  Contiguous axial images were obtained from the base of the skull through the vertex without contrast.  Comparison: None.  Findings: There is low density in the subcortical insular regions bilaterally, left side greater than right.  Findings could represent white matter disease of unknown age but cannot exclude acute or subacute infarct.  No evidence for acute hemorrhage, mass lesion, midline shift or hydrocephalus. Mucosal disease in the ethmoid air  cells and mild mucosal disease in the maxillary sinuses.  No acute bony abnormality.  IMPRESSION: Low density in the subcortical insular regions, left side greater than right.  These findings are indeterminate and recommend further characterization with a brain MRI.  No evidence for acute hemorrhage.  These results were called by telephone on 05/21/2011  at  4:49 Love.m. to  Michael Chapel, PA, who verbally acknowledged these results.  Original Report Authenticated By: Michael Love, M.D.   Mr Michael Love Wo Contrast  05/21/2011  *RADIOLOGY REPORT*  Clinical Data: Facial droop and slurred speech.  Possible stroke.  MRI HEAD WITHOUT AND WITH CONTRAST  Technique:  Multiplanar, multiecho pulse sequences of the brain and surrounding structures were obtained according to standard protocol without and with intravenous contrast  Contrast: 20mL MULTIHANCE GADOBENATE DIMEGLUMINE 529 MG/ML IV SOLN  Comparison: Head CT same day  Findings: Diffusion imaging does not show any acute or subacute infarction.  The brainstem and cerebellum are normal.  The cerebral hemispheres are normal without evidence of old or acute small or large vessel infarction.  Low density in the sub insular white matter on CT probably relates to dilated perivascular spaces.  No evidence of mass lesion, hemorrhage, hydrocephalus or extra-axial collection.  After contrast administration, no abnormal enhancement occurs.  No pituitary mass.  Sinuses, middle ears and mastoids are clear.  No skull or skull base lesion.  IMPRESSION: No pathologic finding.  No evidence of old or acute infarction. Low density in the sub insular white matter at CT probably relates to prominent perivascular spaces.  Original Report Authenticated By: Michael Love, M.D.   CT-scan of the brain no acute abnormality MRI examination of the brain w/wo normal.   ROS as documented in HPI Blood pressure 141/92, pulse 69, temperature 98.3 F (36.8 C), temperature source Oral, resp. rate 18,  height 6' (1.829 m), weight 108.863 kg (240 lb), SpO2 96.00%.  Physical exam ; pleasant middle-aged Caucasian gentleman currently not in distress. Afebrile. Head is nontraumatic. Neck is supple without bruit. Cardiac exam regular heart sounds no murmur or gallop. Lungs clear to auscultation. Abdomen soft nontender.  Neurological exam Awake  Alert oriented x 3. Normal speech and language.eye movements full without nystagmus. Face asymmetric with mild left nasolabial fold a symmetry. There are mild periorbital and nasolabial fold muscular twitchings noted. There is no weakness of eye closure or forehead muscles.. Tongue midline. Normal strength, tone, reflexes and coordination. Normal sensation. Gait deferred. No tremors noted.  Assessment/Plan:  55 year old Caucasian male with postoperative altered mental status, tremulousness and left facial droop likely secondary to medication effect of narcotics who is clearly improved after administration of Narcan. His mild left facial symmetry is old and rest review of his remote left Bell's palsy. Brain imaging studies are normal.  Plan : no further neurological testing is necessary at this point. Limit usage of narcotics and use nonnarcotic pain medications if possible. I had a long discussion with the  patient and his wife and answered questions. Kindly call if necessary. Will sign off.  Michael Love,Michael Love 05/21/2011, 9:18 PM

## 2011-05-21 NOTE — Progress Notes (Signed)
Unknown Jim PA aware of CT results.  Ordered MR of brain per Neuro consult.  States no need for labs at this time.  Pt and family made aware of plan of care.  Pt remains drowsy but alert.  Vital signs stable.

## 2011-05-21 NOTE — Significant Event (Signed)
Rapid Response Event Note  Overview: Time Called: 1550 Arrival Time: 1555 Event Type: Neurologic  Initial Focused Assessment: Pt lying in bed and became less alert as noticed by family at bedside.   Interventions:Bedside RN reports CBG, see notes, hypertensive but denies pain. Pt is slurring slightly but able to verbalize name, place, and date. Dr Donell Beers to bedside to assess. Continue to monitor. 12ld EKG obtained. Sats 96% on room air. Lopressor IV given as ordered.  Event Summary: stat CT head scan and neuro assessment pending, MRI head ordered after CT. BP after lopressor and return from CT 140/91, HR 73 SR, RR WNL, sats 96% on room air.O2 applied at 2 L [place] Pt reports history of thyroid problems approximately 10 yrs ago (had thyroid storm, deficiency), denies thyroid medication. Denies Vascular studies.   at      at          [facility], Ferd Hibbs

## 2011-05-21 NOTE — Op Note (Signed)
Hernia, Open, Procedure Note  Indications: The patient presented with a history of a right, immediately recurrent after reduction hernia.    Pre-operative Diagnosis: right reducible and intermittently incarcerated.  Post-operative Diagnosis: same  Surgeon: Almond Lint   Assistants: Blima Rich, RNFA  Anesthesia: General endotracheal anesthesia  ASA Class: 2  Procedure Details  The patient was seen again in the Holding Room. The risks, benefits, complications, treatment options, and expected outcomes were discussed with the patient. The possibilities of perforation of viscus, bleeding, infection, the need for additional procedures, and development of chronic pain or numbness were discussed with the patient and/or family. There was concurrence with the proposed plan, and informed consent was obtained. The site of surgery was properly noted/marked. The patient was taken to the Operating Room, identified as Michael Love, and the procedure verified as hernia repair. A Time Out was held and the above information confirmed.  The patient was placed in the supine position and underwent induction of anesthesia, the lower abdomen and groin was prepped and draped in the standard fashion.  An obliquely oriented transverse incision was made. Dissection was carried through the soft tissue to expose the inguinal canal and inguinal ligament along its lower edge. The external oblique fascia was split along the course of its fibers, exposing the inguinal canal. The iliohypogastric nerve was superior to the superior edge of the external oblique, and this was retracted out of the way.  The ilioinguinal nerve was located underneath the external oblique, and it was sharply dissected away from the adjacent structures.  This was tucked behind the inferior external oblique so that the mesh would not incorporate the nerve.    The cord and nerve were looped using a Penrose drain and reflected out of the field.  The indirect hernia sac was dissected off the spermatic cord structures.  It was extremely thick and inflamed.  There was no sliding component to the hernia, and no intraabdominal organs were in the sac.  The sac was closed with a 2-0 silk pursestring suture and reduced into the abdomen.  The right sided Progrip mesh was selected.   This was secured to the pubic tubercle with a 2-0 Prolene suture.  The mesh was passed around the cord, and laid down flat underneath the external oblique.  The ilioinguinal nerve was laid on top of the mesh.   The entire area was anesthetized with Exparel.  The external oblique fashion was then closed in a continuous fashion using 2-0 Vicryl suture taking care not to cause entrapment.  The Scarpa's fascia was closed with a running 3-0 vicryl.  The skin was then closed with interrupted 3-0 vicryl deep dermal sutures and a running 4-0 Monocryl suture.  The skin was cleaned, dried, and dressed with Dermabond.    Instrument, sponge, and needle counts were correct prior to closure and at the conclusion of the case.  Findings: Hernia as above  Estimated Blood Loss: Minimal         Drains: None      Complications: None; patient tolerated the procedure well.         Disposition: PACU - hemodynamically stable.         Condition: stable

## 2011-05-21 NOTE — Transfer of Care (Signed)
Immediate Anesthesia Transfer of Care Note  Patient: Michael Love  Procedure(s) Performed:  HERNIA REPAIR INGUINAL ADULT  Patient Location: PACU  Anesthesia Type: General  Level of Consciousness: sedated, patient cooperative and responds to stimulaton  Airway & Oxygen Therapy: Patient Spontanous Breathing and Patient connected to face mask oxgen  Post-op Assessment: Report given to PACU RN and Post -op Vital signs reviewed and stable  Post vital signs: Reviewed and stable  Complications: No apparent anesthesia complications

## 2011-05-21 NOTE — Progress Notes (Signed)
Pt with mild R facial droop. Getting neuro consult, CT, MR if indicated.

## 2011-05-21 NOTE — Interval H&P Note (Signed)
History and Physical Interval Note:  05/21/2011 7:19 AM  Michael Love  has presented today for surgery, with the diagnosis of * No pre-op diagnosis entered *  The various methods of treatment have been discussed with the patient and family. After consideration of risks, benefits and other options for treatment, the patient has consented to  Procedure(s): HERNIA REPAIR INGUINAL ADULT as a surgical intervention .  The patients' history has been reviewed, patient examined, no change in status, stable for surgery.  I have reviewed the patients' chart and labs.  Questions were answered to the patient's satisfaction.     Michael Love

## 2011-05-21 NOTE — Anesthesia Postprocedure Evaluation (Signed)
Anesthesia Post Note  Patient: Michael Love  Procedure(s) Performed:  HERNIA REPAIR INGUINAL ADULT  Anesthesia type: General  Patient location: PACU  Post pain: Pain level controlled  Post assessment: Post-op Vital signs reviewed  Last Vitals:  Filed Vitals:   05/21/11 1405  BP: 120/75  Pulse: 66  Temp: 36.8 C  Resp: 16    Post vital signs: Reviewed  Level of consciousness: sedated  Complications: No apparent anesthesia complications

## 2011-05-21 NOTE — Progress Notes (Signed)
Pts wife came to desk stating that pt stated he "felt funny".  She thought he felt warm and may have a fever.  She also stated he was waking up "twitching".  VS as charted, BP 164/110.  Pt lethargic, but arousable, stated he "felt funny".  Speech was slow, pt dozed off while speaking.   Pt grips weak b/l, pt had difficulty lifting arms off bed.  Dr.Byerly paged, RRT paged.  CBG 150, 12 lead EKG done.  TO for 0.4mg  Narcan.  RRT nurse present.  See RRT assessment.

## 2011-05-21 NOTE — Anesthesia Procedure Notes (Signed)
Procedure Name: Intubation Date/Time: 05/21/2011 7:31 AM Performed by: Mechele Dawley Patient Re-evaluated:Patient Re-evaluated prior to inductionOxygen Delivery Method: Circle System Utilized Preoxygenation: Pre-oxygenation with 100% oxygen Intubation Type: IV induction Ventilation: Mask ventilation without difficulty Grade View: Grade II Tube type: Oral Airway Equipment and Method: video-laryngoscopy Placement Confirmation: ETT inserted through vocal cords under direct vision,  positive ETCO2,  CO2 detector and breath sounds checked- equal and bilateral Secured at: 22 cm Dental Injury: Teeth and Oropharynx as per pre-operative assessment  Difficulty Due To: Difficulty was anticipated, Difficult Airway- due to anterior larynx and Difficult Airway- due to limited oral opening Future Recommendations: Recommend- induction with short-acting agent, and alternative techniques readily available Comments: DL X 1 with Mac 4 blade-Grade 3 view, unable to place ETT. DL X 1 with Glidescope #4 per Dr Rica Mast- Grade 2 view. ETT placed with Glidescope stylet and confirmed placement with direct visualization.

## 2011-05-21 NOTE — Anesthesia Preprocedure Evaluation (Signed)
Anesthesia Evaluation  Patient identified by MRN, date of birth, ID band Patient awake    Reviewed: Allergy & Precautions, H&P , NPO status , Patient's Chart, lab work & pertinent test results  Airway Mallampati: III TM Distance: <3 FB Neck ROM: Full    Dental  (+) Teeth Intact and Dental Advisory Given   Pulmonary neg pulmonary ROS,    Pulmonary exam normal       Cardiovascular neg cardio ROS Regular Normal    Neuro/Psych Negative Neurological ROS  Negative Psych ROS   GI/Hepatic negative GI ROS, Neg liver ROS,   Endo/Other  Negative Endocrine ROSHyperthyroidism   Renal/GU negative Renal ROS  Genitourinary negative   Musculoskeletal negative musculoskeletal ROS (+)   Abdominal Normal abdominal exam  (+)   Peds negative pediatric ROS (+)  Hematology negative hematology ROS (+)   Anesthesia Other Findings   Reproductive/Obstetrics negative OB ROS                           Anesthesia Physical Anesthesia Plan  ASA: II  Anesthesia Plan: General   Post-op Pain Management:    Induction: Intravenous  Airway Management Planned: Oral ETT  Additional Equipment:   Intra-op Plan:   Post-operative Plan: Extubation in OR  Informed Consent: I have reviewed the patients History and Physical, chart, labs and discussed the procedure including the risks, benefits and alternatives for the proposed anesthesia with the patient or authorized representative who has indicated his/her understanding and acceptance.   Dental advisory given  Plan Discussed with: CRNA  Anesthesia Plan Comments:         Anesthesia Quick Evaluation

## 2011-05-21 NOTE — Progress Notes (Signed)
Asked to see patient as he was having some "twitching."  He received vicodin at 12:25.  He was somewhat sedated so he was given some Narcan.  He was more responsive after this.  His BP was noted to be 160/110s during this episode.  The nursing staff reported diffuse weakness on initial exam.  By the time, Dr. Donell Beers arrived, the patient was stable.  PE: Neuro: decreased strength in LUE, but otherwise normal strength throughout.  He does have some right sided facial droop.  His smile is deformed, specifically on the right side.  He has normal diffuse sensation. Heart: regular Lungs: CTAB  EKG: no changes from pre-op EKG  Impression: 1. ? Vagal incident vs neurologic incident 2. S/p RIH repair 3. HTN (now normalized without any intervention)  Plan: We have requested a neuro consult We are obtaining a CT of the head without contrast to r/o CVA Prn lopressor is written for incase needed Will cancel discharge order and keep for tonight for further evaluation.  Michael Chapel, PA-C 05-21-11 16:28

## 2011-05-21 NOTE — Preoperative (Signed)
Beta Blockers   Reason not to administer Beta Blockers:Not Applicable 

## 2011-05-21 NOTE — Progress Notes (Signed)
Dr.Byerly and Unknown Jim PA present.  Order for 5mg  Lopressor given.  Patient more alert, grips and arms strength stronger.  Head CT ordered.  Pt and family aware of plan of care.

## 2011-05-21 NOTE — Discharge Summary (Signed)
Patient ID: Michael Love MRN: [id number] DOB/AGE: 1955/10/22 55 y.o.  Admit date: 05/19/2011 Discharge date: 05/21/2011  Procedures: open right inguinal hernia repair  Consults: none  Reason for Admission: pt with a RIH that became incarcerated.  It was able to be reduced, but continued to have pain and was admitted.  Admission Diagnoses: 1. Right inguinal hernia  Hospital Course: The patient was admitted.  He was taken to the operating room where he underwent an inguinal hernia repair.  The patient tolerated the procedure well.  He was able to tolerate a diet and his pain was controlled.  He was felt stable for d/c home.  Discharge Diagnoses:  Active Problems:  Incarcerated inguinal hernia   Discharge Medications: Current Discharge Medication List    START taking these medications   Details  HYDROcodone-acetaminophen (NORCO) 5-325 MG per tablet Take 1-2 tablets by mouth every 4 (four) hours as needed. Qty: 40 tablet, Refills: 0      CONTINUE these medications which have NOT CHANGED   Details  b complex vitamins tablet Take 1 tablet by mouth daily.      fish oil-omega-3 fatty acids 1000 MG capsule Take 2 g by mouth daily.      Multiple Vitamins-Minerals (MULTIVITAMINS THER. W/MINERALS) TABS Take 1 tablet by mouth daily.          Discharge Instructions: Follow-up Information    Follow up with [facility], MD in 3 weeks.   Contact information:   [employer] [address] [place] [address] [phone]          Signed: Letha Cape 05/21/2011, 3:07 PM

## 2011-05-21 NOTE — Progress Notes (Signed)
Pt returned from MRI w/o incident. Pt more alert, conversant, and has regained strength. Slid self from bed to MRI stretcher for test. RN present for MRI testing.  No c/o at present.  Family at bedside. Crackers and coke given at pt request. Callbell and bedside table in reach.

## 2011-05-22 NOTE — Discharge Summary (Signed)
Agree with above 

## 2011-05-22 NOTE — Progress Notes (Signed)
Pt dc in wheelchair with RN with all belongings.  Vital signs stable.  Pain controlled.  Discharge instructions given and questions answered.

## 2011-05-22 NOTE — Discharge Summary (Signed)
  D/C Summary Addendum:  Pt had an episode of what we suspect to be a vagal incident.  He had some facial droop and weakness so neuro was consulted.  He had a CT and MR of the head which revealed no evidence of CVA.  The patient feels great this morning.  He is stable for d/c home.  PE: Abd: soft, minimally tender, +BS, ND, incision c/d/i  D/c dx: Patient Active Problem List  Diagnoses  . Incarcerated inguinal hernia   Barnetta Chapel, PA-C 05-22-11, 9:12a

## 2011-05-23 ENCOUNTER — Encounter (HOSPITAL_COMMUNITY): Payer: Self-pay | Admitting: General Surgery

## 2011-05-28 ENCOUNTER — Other Ambulatory Visit (INDEPENDENT_AMBULATORY_CARE_PROVIDER_SITE_OTHER): Payer: Self-pay

## 2011-05-28 DIAGNOSIS — K4090 Unilateral inguinal hernia, without obstruction or gangrene, not specified as recurrent: Secondary | ICD-10-CM

## 2011-05-28 MED ORDER — HYDROCODONE-ACETAMINOPHEN 5-325 MG PO TABS: ORAL_TABLET | ORAL | Status: DC

## 2011-05-28 NOTE — Telephone Encounter (Signed)
Called in protocol Norco 5/325 to [facility].  Pt is aware.

## 2011-05-29 ENCOUNTER — Telehealth (INDEPENDENT_AMBULATORY_CARE_PROVIDER_SITE_OTHER): Payer: Self-pay | Admitting: General Surgery

## 2011-05-29 NOTE — Telephone Encounter (Signed)
Needs a three week follow up. I was not able to find an appt with Dr Donell Beers. Please call patient with an appt.

## 2011-06-03 ENCOUNTER — Telehealth (INDEPENDENT_AMBULATORY_CARE_PROVIDER_SITE_OTHER): Payer: Self-pay | Admitting: General Surgery

## 2011-06-07 ENCOUNTER — Other Ambulatory Visit (INDEPENDENT_AMBULATORY_CARE_PROVIDER_SITE_OTHER): Payer: Self-pay

## 2011-06-07 ENCOUNTER — Ambulatory Visit (INDEPENDENT_AMBULATORY_CARE_PROVIDER_SITE_OTHER): Payer: 59 | Admitting: General Surgery

## 2011-06-07 ENCOUNTER — Encounter (INDEPENDENT_AMBULATORY_CARE_PROVIDER_SITE_OTHER): Payer: Self-pay | Admitting: General Surgery

## 2011-06-07 VITALS — BP 138/86 | HR 70 | Temp 98.2°F | Resp 18 | Ht 73.0 in | Wt 248.6 lb

## 2011-06-07 DIAGNOSIS — K4090 Unilateral inguinal hernia, without obstruction or gangrene, not specified as recurrent: Secondary | ICD-10-CM

## 2011-06-07 DIAGNOSIS — K4030 Unilateral inguinal hernia, with obstruction, without gangrene, not specified as recurrent: Secondary | ICD-10-CM

## 2011-06-07 MED ORDER — HYDROCODONE-ACETAMINOPHEN 5-325 MG PO TABS: ORAL_TABLET | ORAL | Status: DC

## 2011-06-07 NOTE — Patient Instructions (Signed)
No lifting for another weeks! 15 pound limit.  Follow up as needed  Taper off pain meds.  Ice/heat as needed.  May walk.

## 2011-06-07 NOTE — Assessment & Plan Note (Signed)
Doing well post operatively.   No sign of recurrent hernia Tapering off hydrocodone. Reviewed lifting restrictions. Follow up PRN.

## 2011-06-07 NOTE — Progress Notes (Signed)
HISTORY: Pt doing well 6 weeks after R ing hernia repair for incarceration.  He still has some soreness,     EXAM: Head: Normocephalic and atraumatic.  Eyes:  Conjunctivae are normal. Pupils are equal, round, and reactive to light. No scleral icterus.  Resp: No respiratory distress, normal effort. Abd:  Abdomen is soft, non distended and non tender. No masses are palpable.  There is no rebound and no guarding. R hernia site has minimal swelling and minimal tenderness.   Neurological: Alert and oriented to person, place, and time. Coordination normal.  Skin: Skin is warm and dry. No rash noted. No diaphoretic. No erythema. No pallor.  Psychiatric: Normal mood and affect. Normal behavior. Judgment and thought content normal.   ASSESSMENT AND PLAN:   Incarcerated right inguinal hernia Doing well post operatively.   No sign of recurrent hernia Tapering off hydrocodone. Reviewed lifting restrictions. Follow up PRN.     Maudry Diego, MD Surgical Oncology, General & Endocrine Surgery [facility]  Daisy Floro, MD No ref. provider found

## 2012-05-29 ENCOUNTER — Other Ambulatory Visit: Payer: Self-pay | Admitting: Family Medicine

## 2012-05-29 ENCOUNTER — Ambulatory Visit
Admission: RE | Admit: 2012-05-29 | Discharge: 2012-05-29 | Disposition: A | Discharge status: Home / Self Care | Payer: 59 | Source: Ambulatory Visit | Attending: Family Medicine | Admitting: Family Medicine

## 2012-05-29 DIAGNOSIS — R109 Unspecified abdominal pain: Secondary | ICD-10-CM

## 2013-01-15 IMAGING — CR DG CHEST 2V
2 series · 2 of 2 positions shown · non-contrast
Comparison: Radiographs 03/30/2007.

CLINICAL DATA: Preoperative respiratory examination.  No chest
complaints.

CHEST - 2 VIEW

[w chest pa]
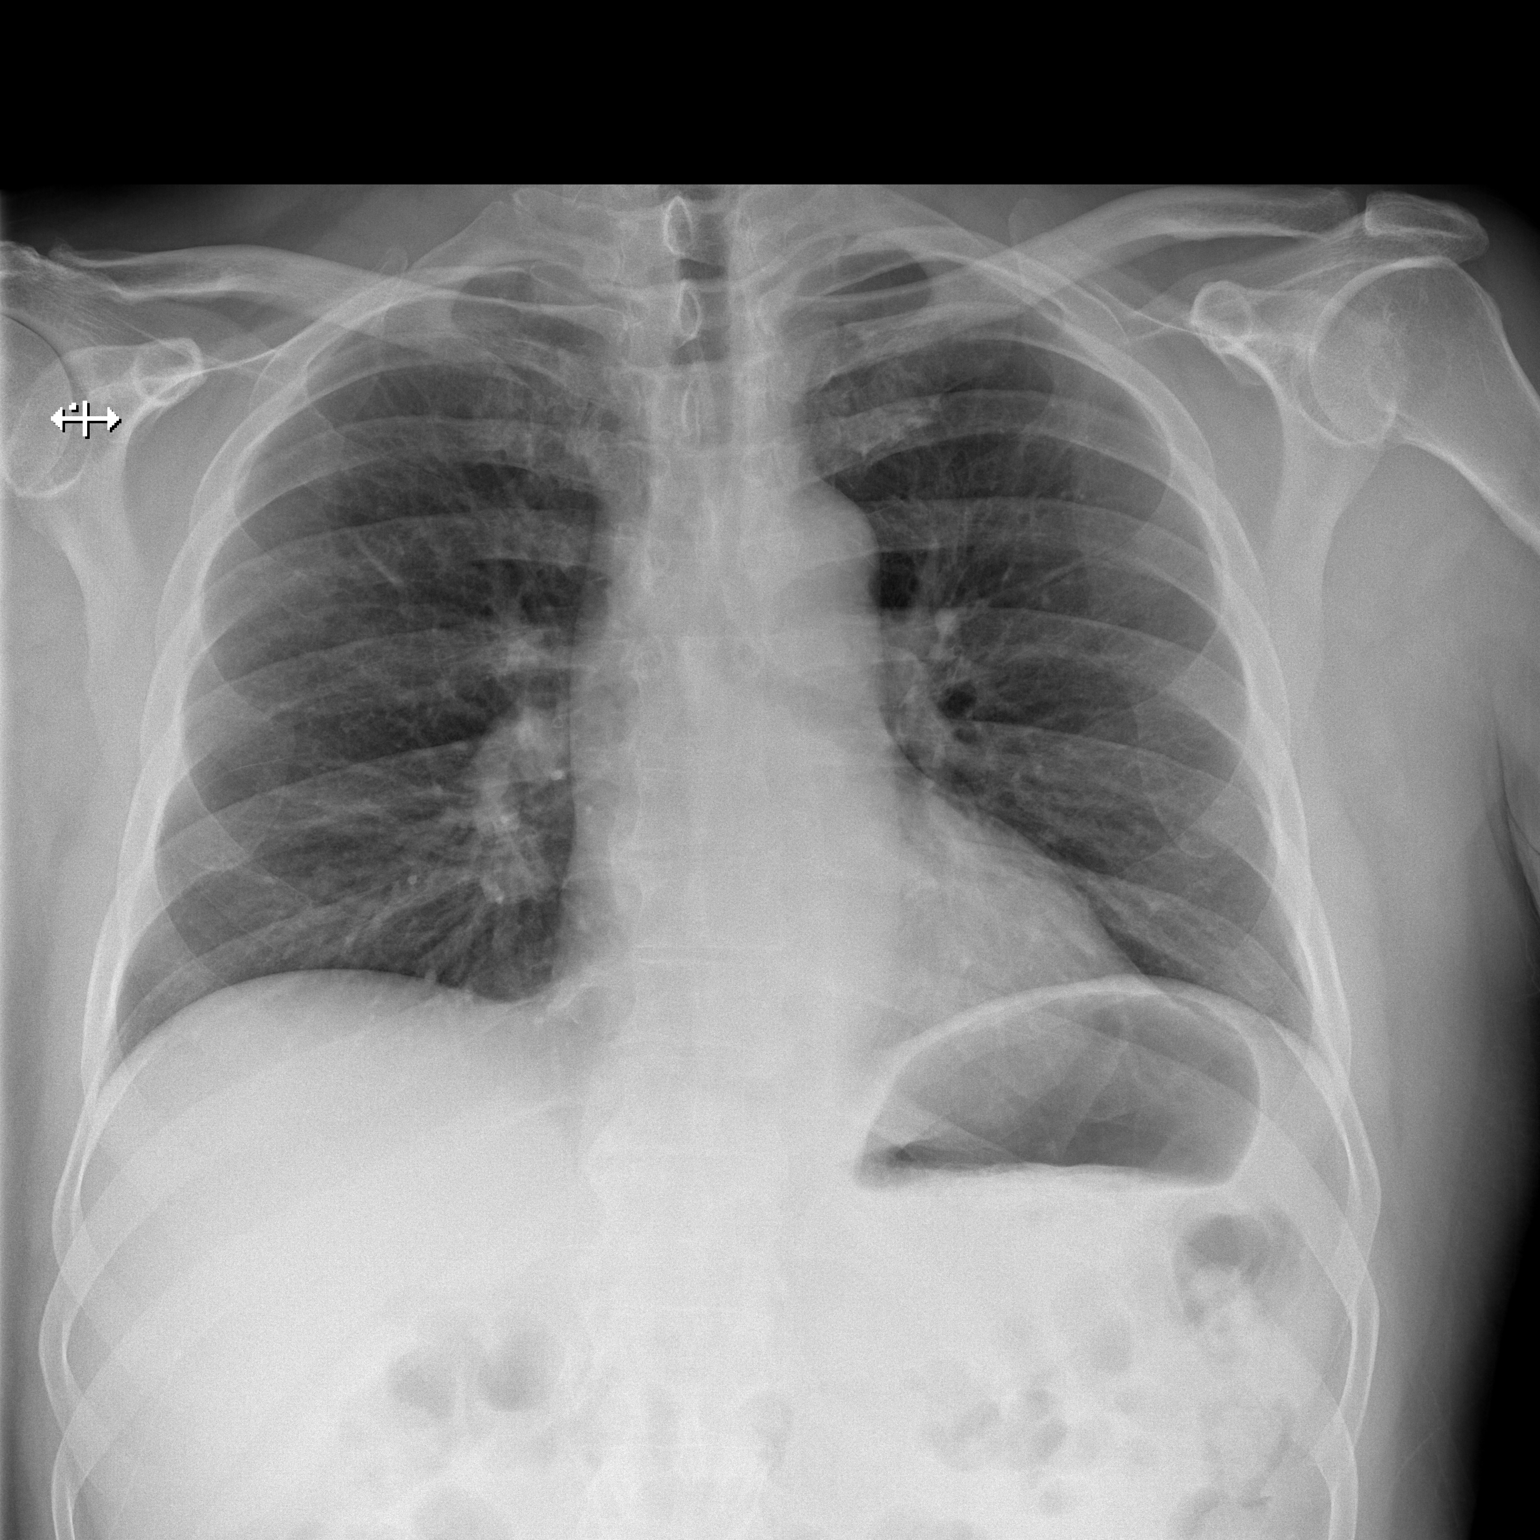

[w chest lat]
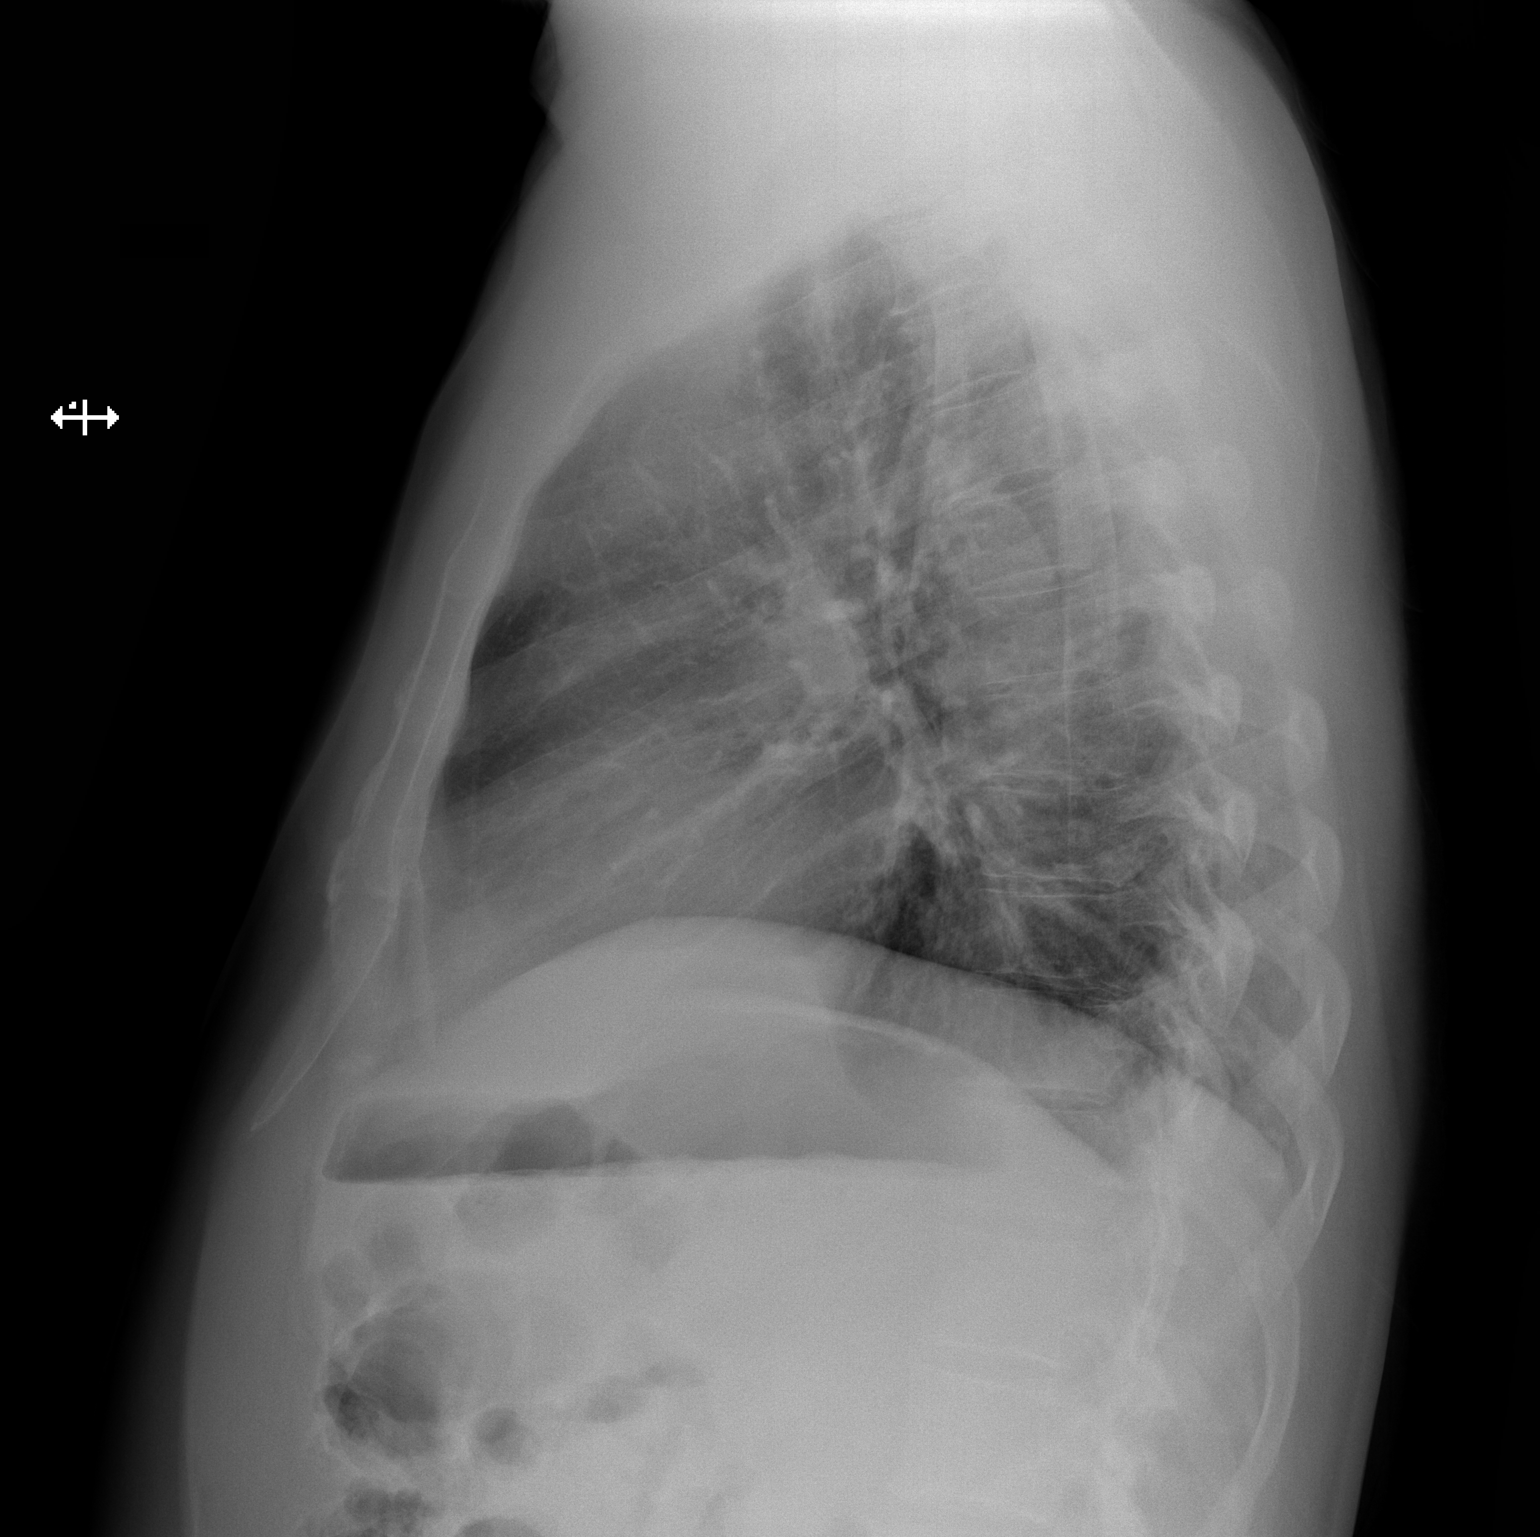

[2 of 2 positions shown; findings below may reference images not displayed]

FINDINGS: The heart size and mediastinal contours are stable.
There are slightly lower lung volumes.  Mild chronic central airway
thickening is present without air space disease, edema or pleural
effusion.  Osseous structures appear unchanged.
IMPRESSION: Mild chronic central airway thickening.  No acute cardiopulmonary
process.

## 2013-01-15 IMAGING — US US SCROTUM
1 series · 14 of 25 positions shown · non-contrast
Comparison: None.

CLINICAL DATA: Scrotal/groin pain.  History of hernia repair.

SCROTAL ULTRASOUND
DOPPLER ULTRASOUND OF THE TESTICLES
TECHNIQUE: Complete ultrasound examination of the testicles,
epididymis, and other scrotal structures was performed.  Color and
spectral Doppler ultrasound were also utilized to evaluate blood
flow to the testicles.

[Series 1: us scrotum · 0.08mm/px · 14 of 42 slices shown]
[im 1/42]
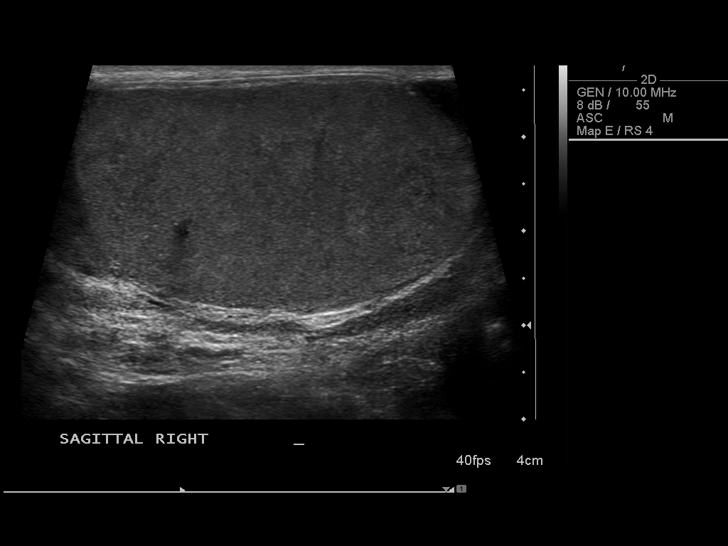
[im 4/42]
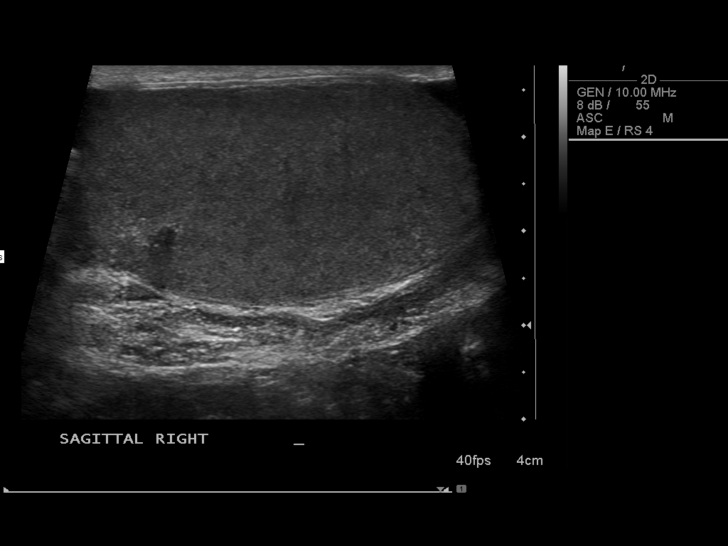
[im 7/42]
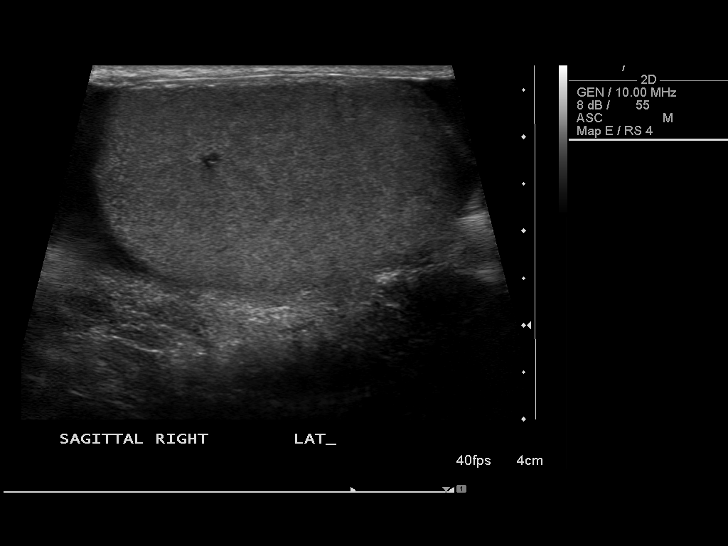
[im 11/42]
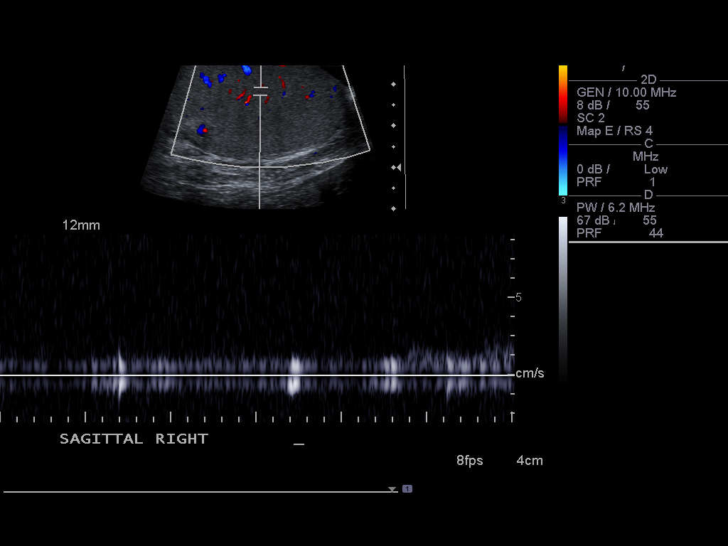
[im 14/42]
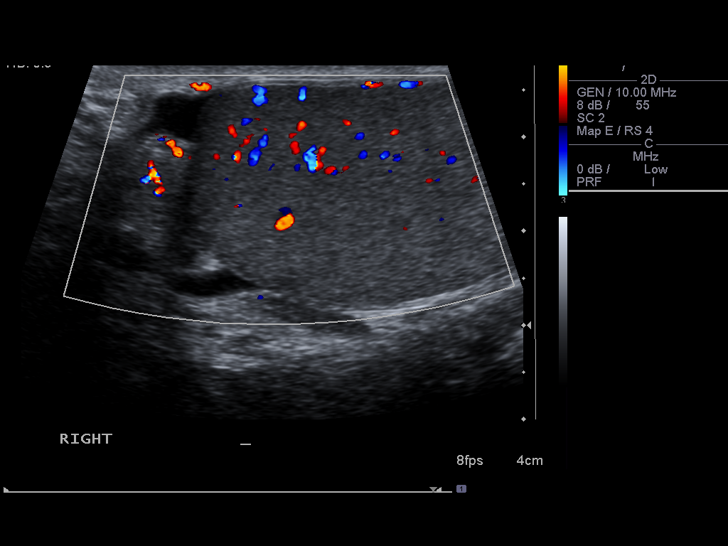
[im 16/42]
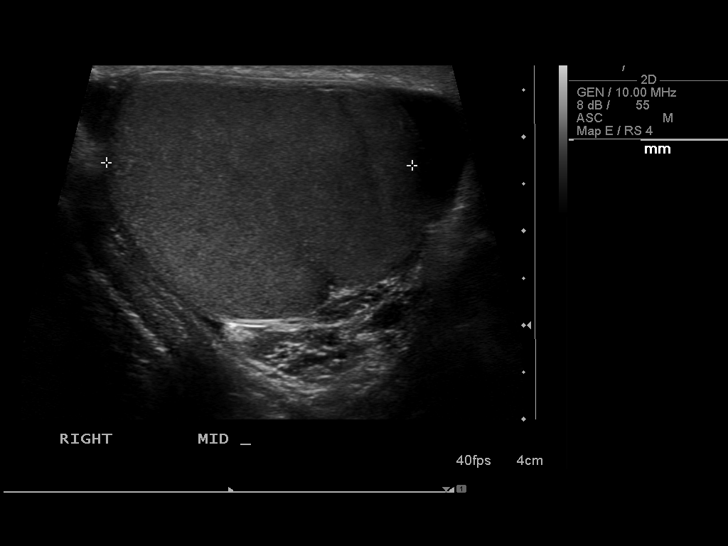
[im 19/42]
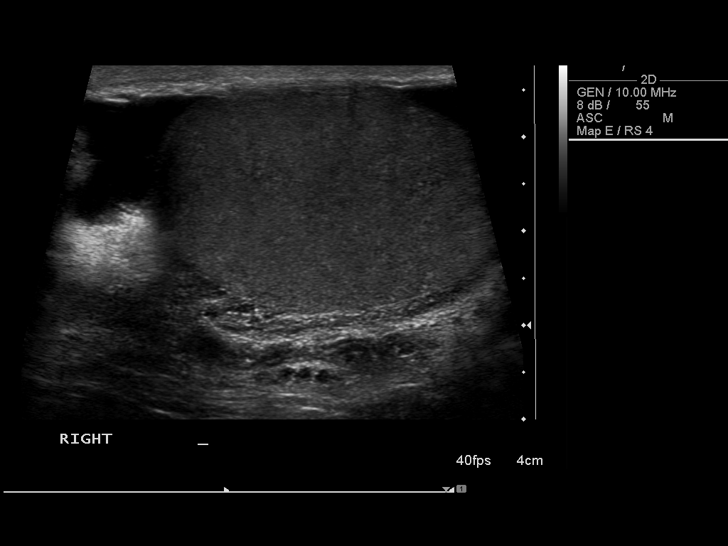
[im 23/42]
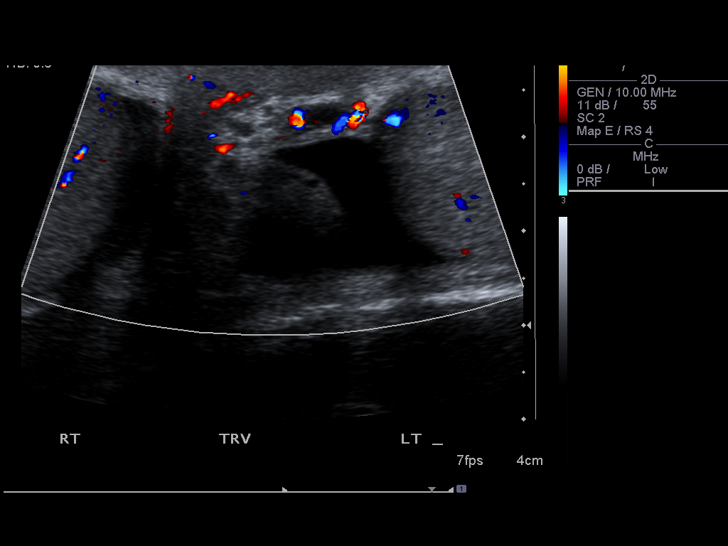
[im 26/42]
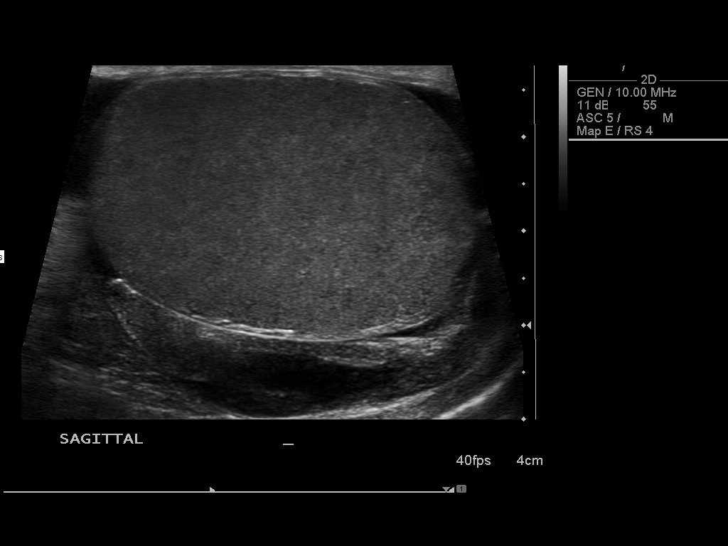
[im 28/42]
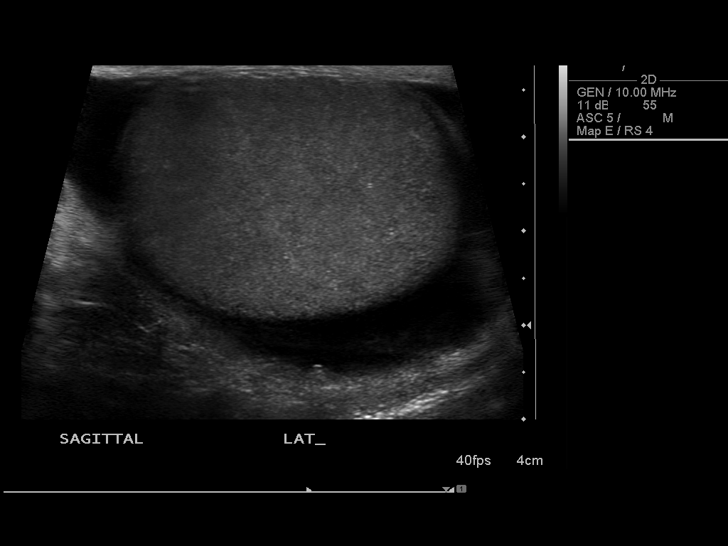
[im 31/42]
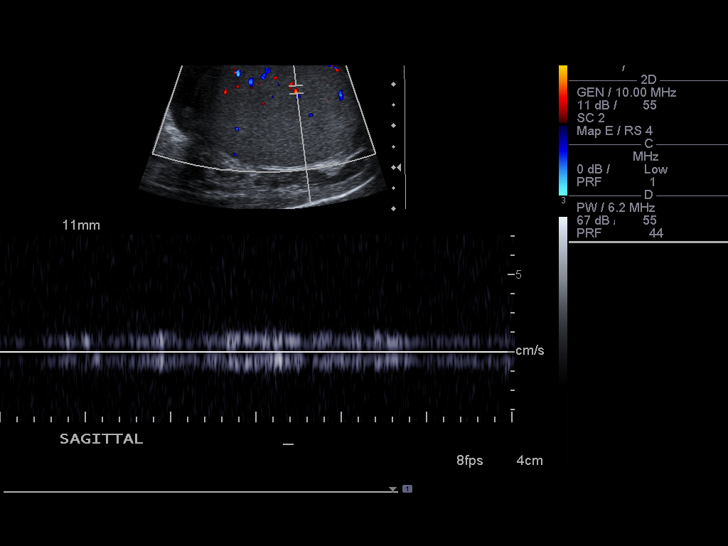
[im 35/42]
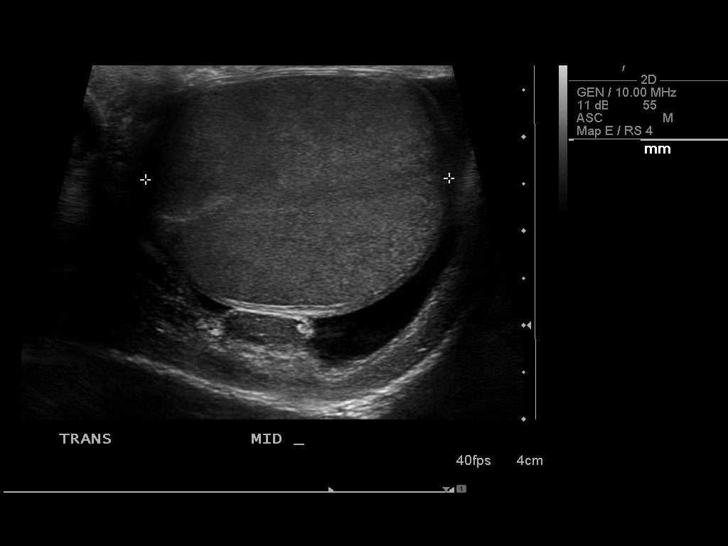
[im 38/42]
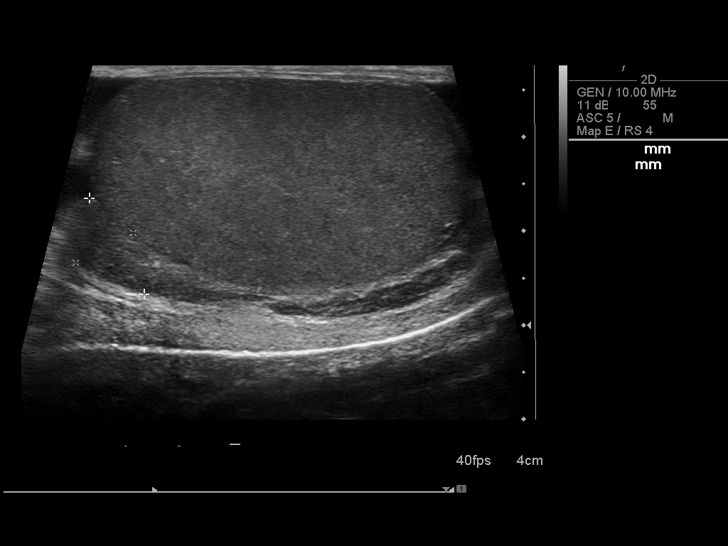
[im 42/42]
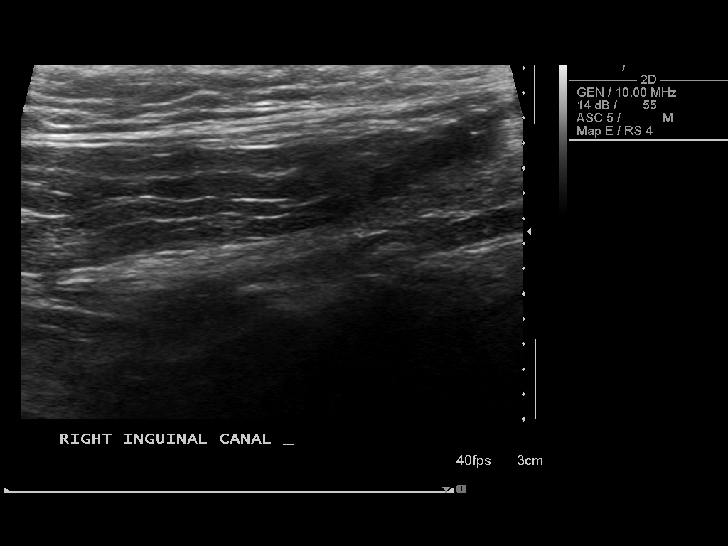

[14 of 25 positions shown; findings below may reference images not displayed]

FINDINGS: The testicles are symmetric in size and echogenicity.
The right testis measures 4.5 x 2.5 x 3.3 cm and the left testis
measures 4.3 x 2.8 x 3.2 cm.  No testicular masses are seen, and
there is no evidence of microlithiasis.

Both epididymal heads are unremarkable in appearance.  There are
small bilateral hydroceles.  There is no evidence of varicocele or
other extra-testicular abnormality.

Blood flow is seen within both testicles on color Doppler
sonography.  Doppler spectral waveforms show both arterial and
venous flow signal in both testicles.
IMPRESSION: 1.  No evidence of testicular mass or torsion.
2.  Small bilateral hydroceles.

## 2015-01-09 ENCOUNTER — Other Ambulatory Visit: Payer: Self-pay | Admitting: Cardiology

## 2015-01-09 ENCOUNTER — Ambulatory Visit
Admission: RE | Admit: 2015-01-09 | Discharge: 2015-01-09 | Disposition: A | Discharge status: Home / Self Care | Payer: 59 | Source: Ambulatory Visit | Attending: Cardiology | Admitting: Cardiology

## 2015-01-09 DIAGNOSIS — R0789 Other chest pain: Secondary | ICD-10-CM

## 2015-01-31 ENCOUNTER — Other Ambulatory Visit: Payer: Self-pay | Admitting: Gastroenterology

## 2016-07-01 DIAGNOSIS — Z125 Encounter for screening for malignant neoplasm of prostate: Secondary | ICD-10-CM | POA: Diagnosis not present

## 2016-07-01 DIAGNOSIS — Z79899 Other long term (current) drug therapy: Secondary | ICD-10-CM | POA: Diagnosis not present

## 2016-07-01 DIAGNOSIS — E7800 Pure hypercholesterolemia, unspecified: Secondary | ICD-10-CM | POA: Diagnosis not present

## 2016-07-09 DIAGNOSIS — Z0000 Encounter for general adult medical examination without abnormal findings: Secondary | ICD-10-CM | POA: Diagnosis not present

## 2016-07-09 DIAGNOSIS — Z79899 Other long term (current) drug therapy: Secondary | ICD-10-CM | POA: Diagnosis not present

## 2016-11-12 DIAGNOSIS — H6123 Impacted cerumen, bilateral: Secondary | ICD-10-CM | POA: Diagnosis not present

## 2017-01-07 DIAGNOSIS — A084 Viral intestinal infection, unspecified: Secondary | ICD-10-CM | POA: Diagnosis not present

## 2017-01-07 DIAGNOSIS — G894 Chronic pain syndrome: Secondary | ICD-10-CM | POA: Diagnosis not present

## 2017-01-07 DIAGNOSIS — Z79899 Other long term (current) drug therapy: Secondary | ICD-10-CM | POA: Diagnosis not present

## 2017-01-07 DIAGNOSIS — J4540 Moderate persistent asthma, uncomplicated: Secondary | ICD-10-CM | POA: Diagnosis not present

## 2017-02-19 DIAGNOSIS — A6920 Lyme disease, unspecified: Secondary | ICD-10-CM | POA: Diagnosis not present

## 2017-07-03 DIAGNOSIS — Z79899 Other long term (current) drug therapy: Secondary | ICD-10-CM | POA: Diagnosis not present

## 2017-07-03 DIAGNOSIS — Z125 Encounter for screening for malignant neoplasm of prostate: Secondary | ICD-10-CM | POA: Diagnosis not present

## 2017-07-03 DIAGNOSIS — Z13220 Encounter for screening for lipoid disorders: Secondary | ICD-10-CM | POA: Diagnosis not present

## 2017-07-14 ENCOUNTER — Other Ambulatory Visit: Payer: Self-pay | Admitting: Gastroenterology

## 2017-07-14 DIAGNOSIS — R1084 Generalized abdominal pain: Secondary | ICD-10-CM | POA: Diagnosis not present

## 2017-07-14 DIAGNOSIS — K6289 Other specified diseases of anus and rectum: Secondary | ICD-10-CM | POA: Diagnosis not present

## 2017-07-14 DIAGNOSIS — Z0000 Encounter for general adult medical examination without abnormal findings: Secondary | ICD-10-CM | POA: Diagnosis not present

## 2017-07-14 DIAGNOSIS — K625 Hemorrhage of anus and rectum: Secondary | ICD-10-CM | POA: Diagnosis not present

## 2017-07-14 DIAGNOSIS — K921 Melena: Secondary | ICD-10-CM | POA: Diagnosis not present

## 2017-07-14 DIAGNOSIS — R739 Hyperglycemia, unspecified: Secondary | ICD-10-CM | POA: Diagnosis not present

## 2017-07-15 ENCOUNTER — Ambulatory Visit
Admission: RE | Admit: 2017-07-15 | Discharge: 2017-07-15 | Disposition: A | Discharge status: Home / Self Care | Payer: 59 | Source: Ambulatory Visit | Attending: Gastroenterology | Admitting: Gastroenterology

## 2017-07-15 DIAGNOSIS — K5730 Diverticulosis of large intestine without perforation or abscess without bleeding: Secondary | ICD-10-CM | POA: Insufficient documentation

## 2017-07-15 DIAGNOSIS — I700 Atherosclerosis of aorta: Secondary | ICD-10-CM | POA: Insufficient documentation

## 2017-07-15 DIAGNOSIS — K6289 Other specified diseases of anus and rectum: Secondary | ICD-10-CM | POA: Insufficient documentation

## 2017-07-15 DIAGNOSIS — R1084 Generalized abdominal pain: Secondary | ICD-10-CM | POA: Diagnosis not present

## 2017-07-15 DIAGNOSIS — I708 Atherosclerosis of other arteries: Secondary | ICD-10-CM | POA: Diagnosis not present

## 2017-07-15 DIAGNOSIS — K625 Hemorrhage of anus and rectum: Secondary | ICD-10-CM | POA: Insufficient documentation

## 2017-07-15 DIAGNOSIS — R109 Unspecified abdominal pain: Secondary | ICD-10-CM | POA: Diagnosis not present

## 2017-07-15 HISTORY — DX: Unspecified asthma, uncomplicated: J45.909

## 2017-07-15 LAB — POCT I-STAT CREATININE: CREATININE: 1.1 mg/dL (ref 0.61–1.24)

## 2017-07-15 MED ORDER — IOPAMIDOL (ISOVUE-300) INJECTION 61%
100.0000 mL | Freq: Once | INTRAVENOUS | Status: AC | PRN
  Administered 2017-07-15: 100 mL via INTRAVENOUS

## 2017-07-23 DIAGNOSIS — K635 Polyp of colon: Secondary | ICD-10-CM | POA: Diagnosis not present

## 2017-07-23 DIAGNOSIS — D126 Benign neoplasm of colon, unspecified: Secondary | ICD-10-CM | POA: Diagnosis not present

## 2017-07-23 DIAGNOSIS — D124 Benign neoplasm of descending colon: Secondary | ICD-10-CM | POA: Diagnosis not present

## 2017-07-23 DIAGNOSIS — R1084 Generalized abdominal pain: Secondary | ICD-10-CM | POA: Diagnosis not present

## 2017-07-23 DIAGNOSIS — D123 Benign neoplasm of transverse colon: Secondary | ICD-10-CM | POA: Diagnosis not present

## 2017-07-23 DIAGNOSIS — K5730 Diverticulosis of large intestine without perforation or abscess without bleeding: Secondary | ICD-10-CM | POA: Diagnosis not present

## 2017-07-23 DIAGNOSIS — K621 Rectal polyp: Secondary | ICD-10-CM | POA: Diagnosis not present

## 2017-07-23 DIAGNOSIS — K644 Residual hemorrhoidal skin tags: Secondary | ICD-10-CM | POA: Diagnosis not present

## 2017-08-08 ENCOUNTER — Ambulatory Visit: Payer: Self-pay | Admitting: General Surgery

## 2017-08-08 DIAGNOSIS — D126 Benign neoplasm of colon, unspecified: Secondary | ICD-10-CM | POA: Diagnosis not present

## 2017-08-08 DIAGNOSIS — D123 Benign neoplasm of transverse colon: Secondary | ICD-10-CM | POA: Diagnosis not present

## 2017-08-08 NOTE — H&P (Signed)
PATIENT PROFILE: Michael Love is a 61 y.o. male who presents to the Clinic for consultation at the request of Dr. Skulskie for evaluation of tubular adenoma of hepatic flexure.  PCP:  Babaoff, Marcus Elijah, MD  HISTORY OF PRESENT ILLNESS: Michael Love reports had colonoscopy due to complain of rectal bleeding. The patient refers that he started having daily rectal bleeding since few months ago with associated abdominal pain. Patient refers pain was with bowel movement. Pain lasted a short period of time and resolved by itself.  Pain does not radiates to other part of the body. He thought at the beginning that it was due to hemorrhoids but it persisted. He mentioned the rectal bleeding to the primary care physician who refer the patient for colonoscopy. Patient refers that previous colonoscopy ~9 years ago. He recalls that at that moment the physician who did the colonoscopy (no results available to review), told him that he needed resection of part of his intestine but he never got the information to proceed with the surgery. Denies fever or chills.   Patient do refers having history of Lyme disease since more than 20 years ago and having periods of feeling tired. Currently patient refers feeling well.    PROBLEM LIST:         Problem List  Date Reviewed: 01/07/2017         Noted   Pure hypercholesterolemia 12/22/2014   Asthma, unspecified 11/15/2013   Anxiety state, unspecified 11/15/2013   Chronic pain 11/15/2013      GENERAL REVIEW OF SYSTEMS:   General ROS: positive for - fatigue, weight gain Negative for fever or chills Allergy and Immunology ROS: negative for - hives  Hematological and Lymphatic ROS: negative for - bleeding problems or bruising, negative for palpable nodes Endocrine ROS: negative for - heat or cold intolerance, hair changes Respiratory ROS: negative for - cough, shortness of breath or wheezing Cardiovascular ROS: no chest pain or palpitations GI  ROS: See HPI Musculoskeletal ROS: positive for - joint swelling or muscle pain Neurological ROS: negative for - confusion, syncope Dermatological ROS: negative for pruritus and rash Psychiatric: Positive for anxiety, Negative for depression, difficulty sleeping and memory loss  MEDICATIONS: CurrentMedications        Current Outpatient Medications  Medication Sig Dispense Refill  . clonazePAM (KLONOPIN) 1 MG tablet Take 1 tablet (1 mg total) by mouth once daily as needed for Anxiety 90 tablet 0  . dicyclomine (BENTYL) 10 mg capsule Take 1 capsule (10 mg total) by mouth 4 (four) times daily as needed (abd pain) 60 capsule 3  . HYDROcodone-acetaminophen (NORCO) 5-325 mg tablet Take 1 tablet by mouth 2 (two) times daily as needed for Pain Earliest Fill Date: 08/01/17 60 tablet 0  . peg-electrolyte (NULYTELY) solution TAKE AS DIRECTED FOR COLONIC PREP. 4000 mL 0   No current facility-administered medications for this visit.       ALLERGIES: Patient has no known allergies.  PAST MEDICAL HISTORY:     Past Medical History:  Diagnosis Date  . Asthma without status asthmaticus, unspecified   . Chronic pain   . Thyroid disease     PAST SURGICAL HISTORY:      Past Surgical History:  Procedure Laterality Date  . HERNIA REPAIR       FAMILY HISTORY:      Family History  Problem Relation Age of Onset  . Diabetes type II Mother   . Colon cancer Father   . Prostate cancer Brother        SOCIAL HISTORY: Social History        Socioeconomic History  . Marital status: Married    Spouse name: Not on file  . Number of children: Not on file  . Years of education: Not on file  . Highest education level: Not on file  Occupational History  . Not on file  Social Needs  . Financial resource strain: Not on file  . Food insecurity:    Worry: Not on file    Inability: Not on file  . Transportation needs:    Medical: Not on file    Non-medical: Not on file   Tobacco Use  . Smoking status: Former Smoker    Years: 1.00    Last attempt to quit: 11/16/1971    Years since quitting: 45.7  . Smokeless tobacco: Never Used  Substance and Sexual Activity  . Alcohol use: Not on file  . Drug use: Not on file  . Sexual activity: Not on file  Lifestyle  . Physical activity:    Days per week: Not on file    Minutes per session: Not on file  . Stress: Not on file  Relationships  . Social connections:    Talks on phone: Not on file    Gets together: Not on file    Attends religious service: Not on file    Active member of club or organization: Not on file    Attends meetings of clubs or organizations: Not on file    Relationship status: Not on file  . Intimate partner violence:    Fear of current or ex partner: Not on file    Emotionally abused: Not on file    Physically abused: Not on file    Forced sexual activity: Not on file  Other Topics Concern  . Not on file  Social History Narrative  . Not on file    PHYSICAL EXAM:    Vitals:   08/08/17 1113  BP: (!) 140/91  Pulse: 72  Temp: 36.4 C (97.6 F)   Estimated body mass index is 32.85 kg/m as calculated from the following:   Height as of this encounter: 185.4 cm (6' 1").   Weight as of this encounter: 112.9 kg (249 lb).  GENERAL: Alert, active, oriented x3  HEENT: Pupils equal reactive to light. Extraocular movements are intact. Sclera clear. Palpebral conjunctiva normal red color.Pharynx clear.  NECK: Supple with no palpable mass and no adenopathy.  LUNGS: Sound clear with no rales rhonchi or wheezes.  HEART: Regular rhythm S1 and S2 without murmur.  ABDOMEN: Soft and depressible, nontender with no palpable mass, no hepatomegaly.   EXTREMITIES: Well-developed well-nourished symmetrical with no dependent edema.  NEUROLOGICAL: Awake alert oriented, facial expression symmetrical, moving all extremities.  REVIEW OF DATA: I have  reviewed the following data today:      Orders Only on 07/15/2017  Component Date Value  . White Blood Cell Count -* 07/15/2017 8.3   . Red Blood Cell Count - L* 07/15/2017 4.78   . Hemoglobin - [facility] 07/15/2017 14.7   . Hematocrit - [facility] 07/15/2017 43.1   . MCV - [facility] 07/15/2017 90   . MCH  - [facility] 07/15/2017 30.8   . MCHC - [facility] 07/15/2017 34.1   . RDW - [facility] 07/15/2017 13.7   . Platelet Count - [facility] 07/15/2017 293   . Neutrophils - [facility] 07/15/2017 47   . LYMPHS -  [facility] 07/15/2017 37   . Monocytes - [facility] 07/15/2017 8   .  Eos - [facility] 07/15/2017 6   . Basos - [facility] 07/15/2017 1   . Neutrophils (Absolute) -* 07/15/2017 4.0   . Lymphs (Absolute) - [facility] 07/15/2017 3.1   . Monocytes(Absolute) - [facility] 07/15/2017 0.6   . Eos (Absolute) - [facility] 07/15/2017 0.5*  . Baso (Absolute) - [facility] 07/15/2017 0.1   . Immature Granulocytes - * 07/15/2017 1   . Immature Grans (Abs) - L* 07/15/2017 0.0   Orders Only on 07/03/2017  Component Date Value  . White Blood Cell Count -* 07/03/2017 7.6   . Red Blood Cell Count - L* 07/03/2017 5.09   . Hemoglobin - [facility] 07/03/2017 15.6   . Hematocrit - [facility] 07/03/2017 45.6   . MCV - [facility] 07/03/2017 90   . MCH  - [facility] 07/03/2017 30.6   . MCHC - [facility] 07/03/2017 34.2   . RDW - [facility] 07/03/2017 14.1   . Platelet Count - [facility] 07/03/2017 355   . Neutrophils - [facility] 07/03/2017 45   . LYMPHS -  [facility] 07/03/2017 38   . Monocytes - [facility] 07/03/2017 7   . Eos - [facility] 07/03/2017 9   . Basos - [facility] 07/03/2017 1   . Neutrophils (Absolute) -* 07/03/2017 3.4   . Lymphs (Absolute) - [facility] 07/03/2017 2.9   . Monocytes(Absolute) - [facility] 07/03/2017 0.5   . Eos (Absolute) - [facility] 07/03/2017 0.7*  . Baso (Absolute) - [facility] 07/03/2017 0.1   . Immature Granulocytes - * 07/03/2017 0   . Immature Grans (Abs) - L* 07/03/2017 0.0   . Glucose Random - [facility] 07/03/2017 108*  . Blood Urea Nitrogen - [facility]  07/03/2017 9   . Creatinine  - [facility] 07/03/2017 1.09   . eGFR If NonAfricn Am - L* 07/03/2017 72   . eGFR If Africn Am - [facility] 07/03/2017 84   . Bun/Creatinine Ratio - L* 07/03/2017 8*  . Sodium - [facility] 07/03/2017 144   . Potassium - [facility] 07/03/2017 4.3   . Chloride - [facility] 07/03/2017 102   . Carbon Dioxide - [facility] 07/03/2017 24   . Calcium - [facility] 07/03/2017 9.6   . Protein Total - [facility] 07/03/2017 7.3   . Albumin - [facility] 07/03/2017 4.5   . Globulin, Total - [facility] 07/03/2017 2.8   . A/G Ratio - [facility] 07/03/2017 1.6   . Bilirubin Total - [facility] 07/03/2017 0.4   . Alkaline Phosphatase - L* 07/03/2017 53   . Ast - [facility] 07/03/2017 21   . Alanine Aminotransferase* 07/03/2017 23   . Cholesterol, Total - Lab* 07/03/2017 179   . Triglycerides - [facility] 07/03/2017 78   . Hdl Cholesterol - [facility] 07/03/2017 46   . VLDL Cholesterol Cal - L* 07/03/2017 16   . Low Density Lipoprotein * 07/03/2017 117*  . Prostate Specific Ag, Se* 07/03/2017 0.3   . Specimen Status Report -* 07/03/2017 Comment     Colonoscopy images and pathology results were personally reviewed and discussed with patient.   ASSESSMENT: Mr. Kozma is a 61 y.o. male presenting for consultation for tubular adenoma of hepatic flexure. The polyp is >50 cm as per colonoscopy and is unresectable through colonoscopy.   Patient was oriented about the diagnosis of the large polyp with tubular adenoma that was unresectable due to size (>5cm). Patient was also oriented about the treatment alternative that I can offer which is surgical resection. The patient was oriented about the need of a right extended hemicolectomy due to the localization of the polyp (hepatic flexure).   Surgical procedure discussed which is Laparoscopic hand assisted extended right hemicolectomy. The benefits and risk of the procedure were discussed including: bleeding, infection, small or large intestine injury, anastomosis leak, bowel  obstruction, needs of re operation, injury to other organs such as liver, duodenum, pancreas, kidney and ureters. Other complications from anesthesia also discusses such as reaction to medications, heart complications, pneumonia, among others.   PLAN: 1.Laparoscopic hand assisted extended right hemicolectomy 44205 2. CBC, CMP, Chest xray 3. Internal medicine clearance 4. Take bowel preparation as per instruction  Patient verbalized understanding, all questions were answered, and were agreeable with the plan outlined above.   Herbert Pun, MD  Electronically signed by Herbert Pun, MD

## 2017-08-08 NOTE — H&P (View-Only) (Signed)
PATIENT PROFILE: Michael Love is a 62 y.o. male who presents to the Clinic for consultation at the request of Dr. Gustavo Lah for evaluation of tubular adenoma of hepatic flexure.  PCP:  Barnabas Lister, MD  HISTORY OF PRESENT ILLNESS: Mr. Michael Love reports had colonoscopy due to complain of rectal bleeding. The patient refers that he started having daily rectal bleeding since few months ago with associated abdominal pain. Patient refers pain was with bowel movement. Pain lasted a short period of time and resolved by itself.  Pain does not radiates to other part of the body. He thought at the beginning that it was due to hemorrhoids but it persisted. He mentioned the rectal bleeding to the primary care physician who refer the patient for colonoscopy. Patient refers that previous colonoscopy ~9 years ago. He recalls that at that moment the physician who did the colonoscopy (no results available to review), told him that he needed resection of part of his intestine but he never got the information to proceed with the surgery. Denies fever or chills.   Patient do refers having history of Lyme disease since more than 20 years ago and having periods of feeling tired. Currently patient refers feeling well.    PROBLEM LIST:         Problem List  Date Reviewed: 01/07/2017         Noted   Pure hypercholesterolemia 12/22/2014   Asthma, unspecified 11/15/2013   Anxiety state, unspecified 11/15/2013   Chronic pain 11/15/2013      GENERAL REVIEW OF SYSTEMS:   General ROS: positive for - fatigue, weight gain Negative for fever or chills Allergy and Immunology ROS: negative for - hives  Hematological and Lymphatic ROS: negative for - bleeding problems or bruising, negative for palpable nodes Endocrine ROS: negative for - heat or cold intolerance, hair changes Respiratory ROS: negative for - cough, shortness of breath or wheezing Cardiovascular ROS: no chest pain or palpitations GI  ROS: See HPI Musculoskeletal ROS: positive for - joint swelling or muscle pain Neurological ROS: negative for - confusion, syncope Dermatological ROS: negative for pruritus and rash Psychiatric: Positive for anxiety, Negative for depression, difficulty sleeping and memory loss  MEDICATIONS: CurrentMedications        Current Outpatient Medications  Medication Sig Dispense Refill  . clonazePAM (KLONOPIN) 1 MG tablet Take 1 tablet (1 mg total) by mouth once daily as needed for Anxiety 90 tablet 0  . dicyclomine (BENTYL) 10 mg capsule Take 1 capsule (10 mg total) by mouth 4 (four) times daily as needed (abd pain) 60 capsule 3  . HYDROcodone-acetaminophen (NORCO) 5-325 mg tablet Take 1 tablet by mouth 2 (two) times daily as needed for Pain Earliest Fill Date: 08/01/17 60 tablet 0  . peg-electrolyte (NULYTELY) solution TAKE AS DIRECTED FOR COLONIC PREP. 4000 mL 0   No current facility-administered medications for this visit.       ALLERGIES: Patient has no known allergies.  PAST MEDICAL HISTORY:     Past Medical History:  Diagnosis Date  . Asthma without status asthmaticus, unspecified   . Chronic pain   . Thyroid disease     PAST SURGICAL HISTORY:      Past Surgical History:  Procedure Laterality Date  . HERNIA REPAIR       FAMILY HISTORY:      Family History  Problem Relation Age of Onset  . Diabetes type II Mother   . Colon cancer Father   . Prostate cancer Brother  SOCIAL HISTORY: Social History        Socioeconomic History  . Marital status: Married    Spouse name: Not on file  . Number of children: Not on file  . Years of education: Not on file  . Highest education level: Not on file  Occupational History  . Not on file  Social Needs  . Financial resource strain: Not on file  . Food insecurity:    Worry: Not on file    Inability: Not on file  . Transportation needs:    Medical: Not on file    Non-medical: Not on file   Tobacco Use  . Smoking status: Former Smoker    Years: 1.00    Last attempt to quit: 11/16/1971    Years since quitting: 45.7  . Smokeless tobacco: Never Used  Substance and Sexual Activity  . Alcohol use: Not on file  . Drug use: Not on file  . Sexual activity: Not on file  Lifestyle  . Physical activity:    Days per week: Not on file    Minutes per session: Not on file  . Stress: Not on file  Relationships  . Social connections:    Talks on phone: Not on file    Gets together: Not on file    Attends religious service: Not on file    Active member of club or organization: Not on file    Attends meetings of clubs or organizations: Not on file    Relationship status: Not on file  . Intimate partner violence:    Fear of current or ex partner: Not on file    Emotionally abused: Not on file    Physically abused: Not on file    Forced sexual activity: Not on file  Other Topics Concern  . Not on file  Social History Narrative  . Not on file    PHYSICAL EXAM:    Vitals:   08/08/17 1113  BP: (!) 140/91  Pulse: 72  Temp: 36.4 C (97.6 F)   Estimated body mass index is 32.85 kg/m as calculated from the following:   Height as of this encounter: 185.4 cm (6' 1").   Weight as of this encounter: 112.9 kg (249 lb).  GENERAL: Alert, active, oriented x3  HEENT: Pupils equal reactive to light. Extraocular movements are intact. Sclera clear. Palpebral conjunctiva normal red color.Pharynx clear.  NECK: Supple with no palpable mass and no adenopathy.  LUNGS: Sound clear with no rales rhonchi or wheezes.  HEART: Regular rhythm S1 and S2 without murmur.  ABDOMEN: Soft and depressible, nontender with no palpable mass, no hepatomegaly.   EXTREMITIES: Well-developed well-nourished symmetrical with no dependent edema.  NEUROLOGICAL: Awake alert oriented, facial expression symmetrical, moving all extremities.  REVIEW OF DATA: I have  reviewed the following data today:      Orders Only on 07/15/2017  Component Date Value  . White Blood Cell Count -* 07/15/2017 8.3   . Red Blood Cell Count - L* 07/15/2017 4.78   . Hemoglobin - [facility] 07/15/2017 14.7   . Hematocrit - [facility] 07/15/2017 43.1   . MCV - [facility] 07/15/2017 90   . MCH  - [facility] 07/15/2017 30.8   . MCHC - [facility] 07/15/2017 34.1   . RDW - [facility] 07/15/2017 13.7   . Platelet Count - [facility] 07/15/2017 293   . Neutrophils - [facility] 07/15/2017 47   . LYMPHS -  [facility] 07/15/2017 37   . Monocytes - [facility] 07/15/2017 8   .  Eos - [facility] 07/15/2017 6   . Basos - [facility] 07/15/2017 1   . Neutrophils (Absolute) -* 07/15/2017 4.0   . Lymphs (Absolute) - [facility] 07/15/2017 3.1   . Monocytes(Absolute) - [facility] 07/15/2017 0.6   . Eos (Absolute) - [facility] 07/15/2017 0.5*  . Baso (Absolute) - [facility] 07/15/2017 0.1   . Immature Granulocytes - * 07/15/2017 1   . Immature Grans (Abs) - L* 07/15/2017 0.0   Orders Only on 07/03/2017  Component Date Value  . White Blood Cell Count -* 07/03/2017 7.6   . Red Blood Cell Count - L* 07/03/2017 5.09   . Hemoglobin - [facility] 07/03/2017 15.6   . Hematocrit - [facility] 07/03/2017 45.6   . MCV - [facility] 07/03/2017 90   . MCH  - [facility] 07/03/2017 30.6   . MCHC - [facility] 07/03/2017 34.2   . RDW - [facility] 07/03/2017 14.1   . Platelet Count - [facility] 07/03/2017 355   . Neutrophils - [facility] 07/03/2017 45   . LYMPHS -  [facility] 07/03/2017 38   . Monocytes - [facility] 07/03/2017 7   . Eos - [facility] 07/03/2017 9   . Basos - [facility] 07/03/2017 1   . Neutrophils (Absolute) -* 07/03/2017 3.4   . Lymphs (Absolute) - [facility] 07/03/2017 2.9   . Monocytes(Absolute) - [facility] 07/03/2017 0.5   . Eos (Absolute) - [facility] 07/03/2017 0.7*  . Baso (Absolute) - [facility] 07/03/2017 0.1   . Immature Granulocytes - * 07/03/2017 0   . Immature Grans (Abs) - L* 07/03/2017 0.0   . Glucose Random - [facility] 07/03/2017 108*  . Blood Urea Nitrogen - [facility]  07/03/2017 9   . Creatinine  - [facility] 07/03/2017 1.09   . eGFR If NonAfricn Am - L* 07/03/2017 72   . eGFR If Africn Am - [facility] 07/03/2017 84   . Bun/Creatinine Ratio - L* 07/03/2017 8*  . Sodium - [facility] 07/03/2017 144   . Potassium - [facility] 07/03/2017 4.3   . Chloride - [facility] 07/03/2017 102   . Carbon Dioxide - [facility] 07/03/2017 24   . Calcium - [facility] 07/03/2017 9.6   . Protein Total - [facility] 07/03/2017 7.3   . Albumin - [facility] 07/03/2017 4.5   . Globulin, Total - [facility] 07/03/2017 2.8   . A/G Ratio - [facility] 07/03/2017 1.6   . Bilirubin Total - [facility] 07/03/2017 0.4   . Alkaline Phosphatase - L* 07/03/2017 53   . Ast - [facility] 07/03/2017 21   . Alanine Aminotransferase* 07/03/2017 23   . Cholesterol, Total - [facility] 07/03/2017 179   . Triglycerides - [facility] 07/03/2017 78   . Hdl Cholesterol - [facility] 07/03/2017 46   . VLDL Cholesterol Cal - L* 07/03/2017 16   . Low Density Lipoprotein * 07/03/2017 117*  . Prostate Specific Ag, Se* 07/03/2017 0.3   . Specimen Status Report -* 07/03/2017 Comment     Colonoscopy images and pathology results were personally reviewed and discussed with patient.   ASSESSMENT: Mr. Hyland is a 62 y.o. male presenting for consultation for tubular adenoma of hepatic flexure. The polyp is >50 cm as per colonoscopy and is unresectable through colonoscopy.   Patient was oriented about the diagnosis of the large polyp with tubular adenoma that was unresectable due to size (>5cm). Patient was also oriented about the treatment alternative that I can offer which is surgical resection. The patient was oriented about the need of a right extended hemicolectomy due to the localization of the polyp (hepatic flexure).  Surgical procedure discussed which is Laparoscopic hand assisted extended right hemicolectomy. The benefits and risk of the procedure were discussed including: bleeding, infection, small or large intestine injury, anastomosis leak, bowel  obstruction, needs of re operation, injury to other organs such as liver, duodenum, pancreas, kidney and ureters. Other complications from anesthesia also discusses such as reaction to medications, heart complications, pneumonia, among others.   PLAN: 1.Laparoscopic hand assisted extended right hemicolectomy 44205 2. CBC, CMP, Chest xray 3. Internal medicine clearance 4. Take bowel preparation as per instruction  Patient verbalized understanding, all questions were answered, and were agreeable with the plan outlined above.   Herbert Pun, MD  Electronically signed by Herbert Pun, MD

## 2017-08-11 DIAGNOSIS — D123 Benign neoplasm of transverse colon: Secondary | ICD-10-CM | POA: Diagnosis not present

## 2017-08-11 DIAGNOSIS — D126 Benign neoplasm of colon, unspecified: Secondary | ICD-10-CM | POA: Diagnosis not present

## 2017-08-12 ENCOUNTER — Encounter
Admission: RE | Admit: 2017-08-12 | Discharge: 2017-08-12 | Disposition: A | Discharge status: Home / Self Care | Payer: 59 | Source: Ambulatory Visit | Attending: General Surgery | Admitting: General Surgery

## 2017-08-12 ENCOUNTER — Other Ambulatory Visit: Payer: Self-pay

## 2017-08-12 ENCOUNTER — Emergency Department: Payer: 59

## 2017-08-12 ENCOUNTER — Emergency Department
Admission: EM | Admit: 2017-08-12 | Discharge: 2017-08-12 | Disposition: A | Discharge status: Home / Self Care | Payer: 59 | Attending: Emergency Medicine | Admitting: Emergency Medicine

## 2017-08-12 DIAGNOSIS — R0602 Shortness of breath: Secondary | ICD-10-CM | POA: Diagnosis not present

## 2017-08-12 DIAGNOSIS — Z79899 Other long term (current) drug therapy: Secondary | ICD-10-CM | POA: Diagnosis not present

## 2017-08-12 DIAGNOSIS — Z01810 Encounter for preprocedural cardiovascular examination: Secondary | ICD-10-CM | POA: Insufficient documentation

## 2017-08-12 DIAGNOSIS — Z87891 Personal history of nicotine dependence: Secondary | ICD-10-CM | POA: Insufficient documentation

## 2017-08-12 DIAGNOSIS — R079 Chest pain, unspecified: Secondary | ICD-10-CM | POA: Diagnosis not present

## 2017-08-12 DIAGNOSIS — I447 Left bundle-branch block, unspecified: Secondary | ICD-10-CM | POA: Insufficient documentation

## 2017-08-12 DIAGNOSIS — J45909 Unspecified asthma, uncomplicated: Secondary | ICD-10-CM | POA: Insufficient documentation

## 2017-08-12 DIAGNOSIS — R0600 Dyspnea, unspecified: Secondary | ICD-10-CM

## 2017-08-12 HISTORY — DX: Failed or difficult intubation, initial encounter: T88.4XXA

## 2017-08-12 HISTORY — DX: Lyme disease, unspecified: A69.20

## 2017-08-12 LAB — BASIC METABOLIC PANEL WITH GFR
Anion gap: 10 (ref 5–15)
BUN: 10 mg/dL (ref 6–20)
CO2: 23 mmol/L (ref 22–32)
Calcium: 9 mg/dL (ref 8.9–10.3)
Chloride: 106 mmol/L (ref 101–111)
Creatinine, Ser: 0.8 mg/dL (ref 0.61–1.24)
GFR calc Af Amer: >60 mL/min
GFR calc non Af Amer: >60 mL/min
Glucose, Bld: 91 mg/dL (ref 65–99)
Potassium: 3.6 mmol/L (ref 3.5–5.1)
Sodium: 139 mmol/L (ref 135–145)

## 2017-08-12 LAB — CBC
HCT: 45.8 % (ref 40.0–52.0)
HEMOGLOBIN: 15.5 g/dL (ref 13.0–18.0)
MCH: 30.4 pg (ref 26.0–34.0)
MCHC: 33.9 g/dL (ref 32.0–36.0)
MCV: 89.7 fL (ref 80.0–100.0)
Platelets: 306 10*3/uL (ref 150–440)
RBC: 5.1 MIL/uL (ref 4.40–5.90)
RDW: 13.8 % (ref 11.5–14.5)
WBC: 8 10*3/uL (ref 3.8–10.6)

## 2017-08-12 LAB — TROPONIN I: Troponin I: <0.03 ng/mL

## 2017-08-12 NOTE — ED Notes (Signed)

## 2017-08-12 NOTE — ED Provider Notes (Addendum)
[facility] Emergency Department Provider Note  ____________________________________________   I have reviewed the triage vital signs and the nursing notes. Where available I have reviewed prior notes and, if possible and indicated, outside hospital notes.    HISTORY  Chief Complaint Shortness of Breath    HPI Michael Love is a 62 y.o. male   who states that he unfortunately suffers from asthma as a child, and chronic Lyme disease among other medical problems.  Patient has had a recent abnormal CT scan and is scheduled for a resection of questionable mass in the abdomen on Monday, today is Tuesday.  However, as part of the preop workup it was noted that he had an abnormal EKG.  Patient had a left bundle branch block.  Last EKG that we can find for him was 2012 which did not show a left bundle branch block.  Patient at no time has had any chest pain he does have chronic shortness of breath which has been getting worse over the last for 5 months.  He says it is not exertional.  He denies any leg swelling personal or family history of PE DVT, nausea, vomiting at is noted at no time does he have any chest pain.  Patient does have an inhaler that he sometimes uses which seems to help his shortness of breath.  He denies any cough.  Has had shortness of breath for years, he has been told in the past that he has COPD, he does attribute this to his chronic Lyme disease however, he states over the last 4 months perhaps he has had somewhat increasing shortness of breath he does use a rescue inhaler which sometimes helps.  He has had no orthopnea, no chest pain of any variety no leg swelling no recent surgeries no history of exogenous estrogen use no personal or family history of PE or DVT, patient has had no recent travel, no unilateral leg swelling, and he states that sometimes he has a cough and a wheeze when he walks.  He is no longer taking tobacco products.  Has not smoked  for many years.     Past Medical History:  Diagnosis Date  . Asthma    as a child  . Difficult intubation 2012   difficulty with intubation; had to use glide scope  . 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 Incarcerated inguinal hernia 05/19/2011  . Lyme disease     Patient Active Problem List   Diagnosis Date Noted  . Incarcerated right inguinal hernia 05/19/2011    Past Surgical History:  Procedure Laterality Date  . CYST REMOVAL HAND Left    index finger left hand  . HERNIA REPAIR  1982   repaired hernia in upper abd  . INGUINAL HERNIA REPAIR  05/21/2011   Procedure: HERNIA REPAIR INGUINAL ADULT;  Surgeon: Stark Klein, MD;  Location: [facility] ORS;  Service: General;  Laterality: Right;  . ROTATOR CUFF REPAIR Right 2009   Dr Latanya Maudlin  . TONSILLECTOMY      Prior to Admission medications   Medication Sig Start Date End Date Taking? Authorizing Provider  clonazePAM (KLONOPIN) 1 MG tablet Take 1 mg by mouth daily as needed for anxiety. 05/06/17   [provider]  HYDROcodone-acetaminophen (NORCO) 5-325 MG per tablet 1-2 tablets q 4-6 hrs prn pain. Patient taking differently: Take  1 tablet by mouth 2 (two) times daily.  06/07/11   Stark Klein, MD    Allergies Patient has no known allergies.  Family History  Problem Relation Age of Onset  . Diabetes Mother   . Cancer Father   . Colon cancer Father   . Cancer Brother     Social History Social History   Tobacco Use  . Smoking status: Former Smoker    Types: Cigarettes  . Smokeless tobacco: Never Used  Substance Use Topics  . 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 Drug use: No    Review of Systems Constitutional: No fever/chills Eyes: No visual changes. ENT: No sore throat. No stiff neck no neck pain Cardiovascular: Denies chest  pain. Respiratory: Patient has chronic shortness of breath over months to years Gastrointestinal:   no vomiting.  No diarrhea.  No constipation. Genitourinary: Negative for dysuria. Musculoskeletal: Negative lower extremity swelling Skin: Negative for rash. Neurological: Negative for severe headaches, focal weakness or numbness.   ____________________________________________   PHYSICAL EXAM:  VITAL SIGNS: ED Triage Vitals  Enc Vitals Group     BP 08/12/17 1647 131/80     Pulse Rate 08/12/17 1647 78     Resp 08/12/17 1647 16     Temp 08/12/17 1647 97.8 F (36.6 C)     Temp Source 08/12/17 1647 Oral     SpO2 08/12/17 1647 98 %     Weight 08/12/17 1649 248 lb (112.5 kg)     Height 08/12/17 1649 6\' 1"  (1.854 m)     Head Circumference --      Peak Flow --      Pain Score 08/12/17 1648 3     Pain Loc --      Pain Edu? --      Excl. in [place]? --     Constitutional: Alert and oriented. Well appearing and in no acute distress. Eyes: Conjunctivae are normal Head: Atraumatic HEENT: No congestion/rhinnorhea. Mucous membranes are moist.  Oropharynx non-erythematous Neck:   Nontender with no meningismus, no masses, no stridor Cardiovascular: Normal rate, regular rhythm. Grossly normal heart sounds.  Good peripheral circulation. Respiratory: Normal respiratory effort.  No retractions. Lungs CTAB. Abdominal: Soft and nontender. No distention. No guarding no rebound Back:  There is no focal tenderness or step off.  there is no midline tenderness there are no lesions noted. there is no CVA tenderness Musculoskeletal: No lower extremity tenderness, no upper extremity tenderness. No joint effusions, no DVT signs strong distal pulses no edema Neurologic:  Normal speech and language. No gross focal neurologic deficits are appreciated.  Skin:  Skin is warm, dry and intact. No rash noted. Psychiatric: Mood and affect are anxious. Speech and behavior are  normal.  ____________________________________________   LABS (all labs ordered are listed, but only abnormal results are displayed)  Labs Reviewed  BASIC METABOLIC PANEL  CBC  TROPONIN I    Pertinent labs  results that were available during my care of the patient were reviewed by me and considered in my medical decision making (see chart for details). ____________________________________________  EKG  I personally interpreted any EKGs ordered by me or triage Normal sinus rhythm at 64 bpm, left bundle branch block noted, LAD noted. ____________________________________________  RADIOLOGY  Pertinent labs & imaging results that were available during my care of the patient were reviewed by me and considered in my medical decision making (see chart for details). If possible, patient  and/or family made aware of any abnormal findings.  Dg Chest 2 View  Result Date: 08/12/2017 CLINICAL DATA:  Chest pain EXAM: CHEST  2 VIEW COMPARISON:  01/09/2015 FINDINGS: Minimal atelectasis at the left base. No consolidation or effusion. Cardiomediastinal silhouette within normal limits. No pneumothorax. Degenerative changes of the spine. IMPRESSION: No active cardiopulmonary disease. Electronically Signed   By: Donavan Foil M.D.   On: 08/12/2017 17:14   Personally reviewed chest x-ray ____________________________________________    PROCEDURES  Procedure(s) performed: None  Procedures  Critical Care performed: None  ____________________________________________   INITIAL IMPRESSION / ASSESSMENT AND PLAN / ED COURSE  Pertinent labs & imaging results that were available during my care of the patient were reviewed by me and considered in my medical decision making (see chart for details).  Patient with chronic long-standing dyspnea long history of asthma in the past, uses inhalers for now and again presents with a chest pain-free left bundle branch block, which has apparently evolved sometime  in the last 7 years.  He has no symptoms here of chest pain, and his exertional dyspnea has been present as noted for years, worse over the last several months.  There does not therefore appear to be any acute issue requiring emergent hospitalization however there are some concerns with this patient and therefore I did talk to Dr. Percival Spanish, of cardiology, he and I discussed the patient's finding.  His EKG etc. he recommends the patient be discharged and that they will follow closely as an outpatient for outpatient echo or stress prior to the patient's surgery if possible.  I think this is certainly a reasonable plan, and we will discharge the patient and I will see if we can get him close outpatient follow-up.  Patient is very comfortable with this as well.  Certainly nothing at this time indicate that he has a PE or decompensated CAD etc.  He also understands that if he has chest pain or increased shortness of breath or other concerns he is to return to the emergency room note, patient does take "herbs" for his chronic lung disease and he is stopped taking them.  Is unclear exactly which nerves he is taking but I have advised him to use caution when taking any medications that improved.  Patient adamant that he does not wish to be admitted to the hospital as he feels the same as he always does and I do not think this is unreasonable but again this does limit our ability to further work him up.  Finally, his blood pressure had one high reading here, it is possible that this is chronic or anxiety related but he is not having any chest pain or acute symptoms and would prefer not to stay in the hospital we have advised him therefore to have close outpatient follow-up with primary care doctor for recheck.  Emergency room elevated blood pressure readings are very difficult to interpret and in the absence of symptomology usually do not mandate admission.  He has no headache no chest pain no renal issues, and no change in  his chronic shortness of breath and short no reason for him to stay in the hospital for this incidental finding.  As soon as we told him his blood pressure  ----------------------------------------- 9:17 PM on 08/12/2017 -----------------------------------------  As soon as we told him his troponin was negative his blood pressure went down 20 points when he relaxed.   ____________________________________________   FINAL CLINICAL IMPRESSION(S) / ED DIAGNOSES  Final diagnoses:  None      This chart was dictated using voice recognition software.  Despite best efforts to proofread,  errors can occur which can change meaning.      Schuyler Amor, MD 08/12/17 2102    Schuyler Amor, MD 08/12/17 2107    Schuyler Amor, MD 08/12/17 2117

## 2017-08-12 NOTE — Pre-Procedure Instructions (Signed)
Patient's B/P elevated during pre-admit testing visit and c/o feeling weak and feeling "strange" all day. EKG performed during this visit. Preliminary EKG reading of left bundle branch block. Brought to the attention of Dr. Amie Critchley (anesthesia) which recommended getting medical clearance from the patient's PCP prior to having surgery. Telephone call to the patient to inform him of the abnormal EKG and discussed his elevated B/P and ill feeling; recommended that he call his PCP or go to the Emergency Department for further evaluation. ED triage nurse was called to notify that patient may be coming.

## 2017-08-12 NOTE — ED Notes (Signed)
ED Provider at bedside. 

## 2017-08-12 NOTE — Patient Instructions (Signed)
Your procedure is scheduled on: Monday, August 18, 2017 Report to Same Day Surgery on the 2nd floor in the [place]. To find out your arrival time, please call [phone] between 1PM - 3PM on: Friday, August 15, 2017  REMEMBER: Instructions that are not followed completely may result in serious medical risk, up to and including death; or upon the discretion of your surgeon and anesthesiologist your surgery may need to be rescheduled.  Do not eat food after midnight the night before your procedure.  No gum chewing or hard candies.  You may however, drink CLEAR liquids up to 2 hours before you are scheduled to arrive at the hospital for your procedure.  Do not drink clear liquids within 2 hours of the start of your surgery.  Clear liquids include: - water  - apple juice without pulp - clear gatorade - black coffee or tea (Do NOT add anything to the coffee or tea) Do NOT drink anything that is not on this list.  No Alcohol for 24 hours before or after surgery.  No Smoking including e-cigarettes for 24 hours prior to surgery. No chewable tobacco products for at least 6 hours prior to surgery. No nicotine patches on the day of surgery.  On the morning of surgery brush your teeth with toothpaste and water, you may rinse your mouth with mouthwash if you wish. Do not swallow any  toothpaste of mouthwash.  Notify your doctor if there is any change in your medical condition (cold, fever, infection).  Do not wear jewelry, make-up, hairpins, clips or nail polish.  Do not wear lotions, powders, or perfumes. You may wear deodorant.  Do not shave 48 hours prior to surgery. Men may shave face and neck.  Contacts and dentures may not be worn into surgery.  Do not bring valuables to the hospital. [facility] is not responsible for any belongings or valuables.  TAKE THESE MEDICATIONS THE MORNING OF SURGERY:  1.  HYDROCODONE (if needed for pain)  Use CHG Soap as directed on instruction  sheet.  Bowel prep as instructed by Dr. Windell Moment.  NOW!  Stop Anti-inflammatories such as Advil, Aleve, Ibuprofen, Motrin, Naproxen, Naprosyn, Goodie powder, or aspirin products. (May take Tylenol or Acetaminophen if needed.)  NOW!  Stop ANY OVER THE COUNTER supplements until after surgery.   If you are being admitted to the hospital overnight, leave your suitcase in the car. After surgery it may be brought to your room.  Please call the number above if you have any questions about these instructions.

## 2017-08-12 NOTE — Discharge Instructions (Signed)
Your EKG is different from what it was in 2012.  It is unclear if this is related to your shortness of breath.  Take it easy and do not overexert yourself and you see cardiology.  If you have significant shortness of breath, chest pain, or you feel worse in any way, return to the emergency room.  Otherwise, continue using your inhaler as needed and follow closely with cardiology.  I would also let your primary care doctor know that you are here.

## 2017-08-12 NOTE — ED Notes (Signed)
Pt ambulatory in hallway with this RN. Pt increasingly SHOB throughout walk, although O2 maintained 93-94%. Pt remains 97% on room air while sitting on the stretcher.

## 2017-08-12 NOTE — ED Notes (Signed)
Pt reports SHOB x months, no new onset. occasional emergency inhaler usage. Pt states long hx diagnoses all relating to Lyme disease. Has not seen cardiologist

## 2017-08-12 NOTE — ED Triage Notes (Addendum)
Pt was sent from pre admit testing , pre op for surgery to remove an intestinal mass. Pt states his b/p was elevated and his ECG there suggested his was having a heart attack and referred him to the ED. Pt states he has had some increased SOB "for a while".. The pt is in NAD on arrival. Respirations WNL , skin is warm and dry.Michael Love

## 2017-08-13 ENCOUNTER — Telehealth: Payer: Self-pay | Admitting: Cardiovascular Disease

## 2017-08-13 NOTE — Telephone Encounter (Signed)
-----   Message from Valora Corporal, RN sent at 08/13/2017 11:16 AM EST ----- Regarding: FW: appt needed this week   ----- Message ----- From: Candis Schatz Sent: 08/13/2017  11:08 AM To: [facility] Triage Subject: appt needed this week                          Please see note below from Dr. Percival Spanish.  Thanks Trish  ----- Message ----- From: Minus Breeding, MD Sent: 08/13/2017  10:57 AM To: [place],  Can you gut this patient an appt in [place] this week?  Was in the ED.

## 2017-08-13 NOTE — Telephone Encounter (Signed)
Spoke to patient. He states he is going to wait a little to see [place] He states he is see's PCP for his concerns  He will call back to schedule an appointment

## 2017-08-14 DIAGNOSIS — R9431 Abnormal electrocardiogram [ECG] [EKG]: Secondary | ICD-10-CM | POA: Diagnosis not present

## 2017-08-14 DIAGNOSIS — R0602 Shortness of breath: Secondary | ICD-10-CM | POA: Diagnosis not present

## 2017-08-14 DIAGNOSIS — Z01818 Encounter for other preprocedural examination: Secondary | ICD-10-CM | POA: Diagnosis not present

## 2017-08-15 DIAGNOSIS — I10 Essential (primary) hypertension: Secondary | ICD-10-CM | POA: Diagnosis not present

## 2017-08-15 DIAGNOSIS — Z01818 Encounter for other preprocedural examination: Secondary | ICD-10-CM | POA: Diagnosis not present

## 2017-08-15 DIAGNOSIS — R0602 Shortness of breath: Secondary | ICD-10-CM | POA: Diagnosis not present

## 2017-08-15 DIAGNOSIS — R9431 Abnormal electrocardiogram [ECG] [EKG]: Secondary | ICD-10-CM | POA: Diagnosis not present

## 2017-08-15 DIAGNOSIS — I447 Left bundle-branch block, unspecified: Secondary | ICD-10-CM | POA: Diagnosis not present

## 2017-08-15 DIAGNOSIS — D369 Benign neoplasm, unspecified site: Secondary | ICD-10-CM | POA: Diagnosis not present

## 2017-08-17 MED ORDER — CEFOTETAN DISODIUM 2 G IJ SOLR
2.0000 g | INTRAMUSCULAR | Status: AC
  Administered 2017-08-18: 2 g via INTRAVENOUS
  Filled 2017-08-17: qty 2

## 2017-08-18 ENCOUNTER — Ambulatory Visit: Payer: 59 | Admitting: Anesthesiology

## 2017-08-18 ENCOUNTER — Encounter
Admission: RE | Disposition: A | Discharge status: Home / Self Care | Payer: Self-pay | Source: Ambulatory Visit | Attending: General Surgery

## 2017-08-18 ENCOUNTER — Other Ambulatory Visit: Payer: Self-pay

## 2017-08-18 ENCOUNTER — Encounter: Payer: Self-pay | Admitting: *Deleted

## 2017-08-18 ENCOUNTER — Inpatient Hospital Stay
Admission: RE | Admit: 2017-08-18 | Discharge: 2017-08-22 | DRG: 331 | Disposition: A | Discharge status: Home / Self Care | Payer: 59 | Source: Ambulatory Visit | Attending: General Surgery | Admitting: General Surgery

## 2017-08-18 DIAGNOSIS — I10 Essential (primary) hypertension: Secondary | ICD-10-CM | POA: Diagnosis present

## 2017-08-18 DIAGNOSIS — Z87891 Personal history of nicotine dependence: Secondary | ICD-10-CM | POA: Diagnosis not present

## 2017-08-18 DIAGNOSIS — D126 Benign neoplasm of colon, unspecified: Secondary | ICD-10-CM | POA: Diagnosis not present

## 2017-08-18 DIAGNOSIS — F411 Generalized anxiety disorder: Secondary | ICD-10-CM | POA: Diagnosis present

## 2017-08-18 DIAGNOSIS — D122 Benign neoplasm of ascending colon: Secondary | ICD-10-CM | POA: Diagnosis not present

## 2017-08-18 DIAGNOSIS — E7800 Pure hypercholesterolemia, unspecified: Secondary | ICD-10-CM | POA: Diagnosis present

## 2017-08-18 DIAGNOSIS — E0590 Thyrotoxicosis, unspecified without thyrotoxic crisis or storm: Secondary | ICD-10-CM | POA: Diagnosis not present

## 2017-08-18 DIAGNOSIS — Z79899 Other long term (current) drug therapy: Secondary | ICD-10-CM | POA: Diagnosis not present

## 2017-08-18 DIAGNOSIS — K429 Umbilical hernia without obstruction or gangrene: Secondary | ICD-10-CM | POA: Diagnosis present

## 2017-08-18 DIAGNOSIS — D123 Benign neoplasm of transverse colon: Secondary | ICD-10-CM | POA: Diagnosis not present

## 2017-08-18 DIAGNOSIS — G8929 Other chronic pain: Secondary | ICD-10-CM | POA: Diagnosis present

## 2017-08-18 DIAGNOSIS — D124 Benign neoplasm of descending colon: Secondary | ICD-10-CM | POA: Diagnosis not present

## 2017-08-18 DIAGNOSIS — J45909 Unspecified asthma, uncomplicated: Secondary | ICD-10-CM | POA: Diagnosis present

## 2017-08-18 HISTORY — PX: LAPAROSCOPIC RIGHT HEMI COLECTOMY: SHX5926

## 2017-08-18 SURGERY — LAPAROSCOPIC RIGHT HEMI COLECTOMY
Anesthesia: General | Site: Abdomen | Laterality: Right | Wound class: Contaminated

## 2017-08-18 MED ORDER — FAMOTIDINE 20 MG PO TABS
ORAL_TABLET | ORAL | Status: AC
  Filled 2017-08-18: qty 1

## 2017-08-18 MED ORDER — ALVIMOPAN 12 MG PO CAPS
ORAL_CAPSULE | ORAL | Status: AC
  Filled 2017-08-18: qty 1

## 2017-08-18 MED ORDER — ONDANSETRON HCL 4 MG/2ML IJ SOLN
INTRAMUSCULAR | Status: AC
  Filled 2017-08-18: qty 2

## 2017-08-18 MED ORDER — ACETAMINOPHEN 500 MG PO TABS
1000.0000 mg | ORAL_TABLET | ORAL | Status: AC
  Administered 2017-08-18: 1000 mg via ORAL

## 2017-08-18 MED ORDER — LACTATED RINGERS IV SOLN
INTRAVENOUS | Status: DC
  Administered 2017-08-18: 07:00:00 via INTRAVENOUS

## 2017-08-18 MED ORDER — ONDANSETRON HCL 4 MG/2ML IJ SOLN
4.0000 mg | Freq: Four times a day (QID) | INTRAMUSCULAR | Status: DC | PRN
  Administered 2017-08-20 (×2): 4 mg via INTRAVENOUS
  Filled 2017-08-18 (×2): qty 2

## 2017-08-18 MED ORDER — MORPHINE SULFATE (PF) 4 MG/ML IV SOLN
4.0000 mg | INTRAVENOUS | Status: DC | PRN
Start: 2017-08-18 — End: 2017-08-22
  Administered 2017-08-18 – 2017-08-21 (×13): 4 mg via INTRAVENOUS
  Filled 2017-08-18 (×13): qty 1

## 2017-08-18 MED ORDER — ACETAMINOPHEN 500 MG PO TABS
ORAL_TABLET | ORAL | Status: AC
  Filled 2017-08-18: qty 2

## 2017-08-18 MED ORDER — PHENYLEPHRINE HCL 10 MG/ML IJ SOLN
INTRAMUSCULAR | Status: DC | PRN
  Administered 2017-08-18: 100 ug via INTRAVENOUS

## 2017-08-18 MED ORDER — ROCURONIUM BROMIDE 50 MG/5ML IV SOLN
INTRAVENOUS | Status: AC
  Filled 2017-08-18: qty 1

## 2017-08-18 MED ORDER — MIDAZOLAM HCL 2 MG/2ML IJ SOLN
INTRAMUSCULAR | Status: DC | PRN
  Administered 2017-08-18: 2 mg via INTRAVENOUS

## 2017-08-18 MED ORDER — ALVIMOPAN 12 MG PO CAPS
12.0000 mg | ORAL_CAPSULE | Freq: Two times a day (BID) | ORAL | Status: DC
  Administered 2017-08-19 – 2017-08-21 (×6): 12 mg via ORAL
  Filled 2017-08-18 (×7): qty 1

## 2017-08-18 MED ORDER — SODIUM CHLORIDE 0.9 % IJ SOLN
INTRAMUSCULAR | Status: AC
  Filled 2017-08-18: qty 50

## 2017-08-18 MED ORDER — OXYCODONE HCL 5 MG/5ML PO SOLN: 5.0000 mg | Freq: Once | ORAL | Status: DC | PRN

## 2017-08-18 MED ORDER — ENOXAPARIN SODIUM 40 MG/0.4ML ~~LOC~~ SOLN
40.0000 mg | SUBCUTANEOUS | Status: DC
  Administered 2017-08-19 – 2017-08-22 (×4): 40 mg via SUBCUTANEOUS
  Filled 2017-08-18 (×4): qty 0.4

## 2017-08-18 MED ORDER — FAMOTIDINE 20 MG PO TABS
20.0000 mg | ORAL_TABLET | Freq: Once | ORAL | Status: AC
  Administered 2017-08-18: 20 mg via ORAL

## 2017-08-18 MED ORDER — MIDAZOLAM HCL 2 MG/2ML IJ SOLN
INTRAMUSCULAR | Status: AC
  Filled 2017-08-18: qty 2

## 2017-08-18 MED ORDER — ROCURONIUM BROMIDE 100 MG/10ML IV SOLN
INTRAVENOUS | Status: DC | PRN
  Administered 2017-08-18: 5 mg via INTRAVENOUS
  Administered 2017-08-18: 10 mg via INTRAVENOUS
  Administered 2017-08-18 (×2): 20 mg via INTRAVENOUS
  Administered 2017-08-18: 50 mg via INTRAVENOUS

## 2017-08-18 MED ORDER — ACETAMINOPHEN 325 MG PO TABS: 650.0000 mg | ORAL_TABLET | Freq: Four times a day (QID) | ORAL | Status: DC | PRN

## 2017-08-18 MED ORDER — PHENYLEPHRINE HCL 10 MG/ML IJ SOLN
INTRAMUSCULAR | Status: AC
  Filled 2017-08-18: qty 1

## 2017-08-18 MED ORDER — OXYCODONE HCL 5 MG PO TABS: 5.0000 mg | ORAL_TABLET | Freq: Once | ORAL | Status: DC | PRN

## 2017-08-18 MED ORDER — CLONAZEPAM 1 MG PO TABS: 1.0000 mg | ORAL_TABLET | Freq: Every day | ORAL | Status: DC | PRN

## 2017-08-18 MED ORDER — ONDANSETRON HCL 4 MG/2ML IJ SOLN
INTRAMUSCULAR | Status: DC | PRN
  Administered 2017-08-18: 4 mg via INTRAVENOUS

## 2017-08-18 MED ORDER — SUGAMMADEX SODIUM 500 MG/5ML IV SOLN
INTRAVENOUS | Status: DC | PRN
  Administered 2017-08-18: 250 mg via INTRAVENOUS

## 2017-08-18 MED ORDER — DEXAMETHASONE SODIUM PHOSPHATE 10 MG/ML IJ SOLN
INTRAMUSCULAR | Status: AC
  Filled 2017-08-18: qty 1

## 2017-08-18 MED ORDER — EPHEDRINE SULFATE 50 MG/ML IJ SOLN
INTRAMUSCULAR | Status: AC
  Filled 2017-08-18: qty 2

## 2017-08-18 MED ORDER — GABAPENTIN 300 MG PO CAPS
300.0000 mg | ORAL_CAPSULE | Freq: Two times a day (BID) | ORAL | Status: DC
  Administered 2017-08-19 – 2017-08-22 (×7): 300 mg via ORAL
  Filled 2017-08-18 (×8): qty 1

## 2017-08-18 MED ORDER — BUPIVACAINE-EPINEPHRINE (PF) 0.5% -1:200000 IJ SOLN
INTRAMUSCULAR | Status: AC
  Filled 2017-08-18: qty 30

## 2017-08-18 MED ORDER — SODIUM CHLORIDE 0.9 % IV SOLN
INTRAVENOUS | Status: DC | PRN
  Administered 2017-08-18: 70 mL

## 2017-08-18 MED ORDER — FENTANYL CITRATE (PF) 100 MCG/2ML IJ SOLN
INTRAMUSCULAR | Status: AC
  Administered 2017-08-18: 25 ug via INTRAVENOUS
  Filled 2017-08-18: qty 2

## 2017-08-18 MED ORDER — ONDANSETRON HCL 4 MG PO TABS: 4.0000 mg | ORAL_TABLET | Freq: Four times a day (QID) | ORAL | Status: DC | PRN

## 2017-08-18 MED ORDER — CHLORHEXIDINE GLUCONATE 4 % EX LIQD: 1.0000 "application " | Freq: Once | CUTANEOUS | Status: DC

## 2017-08-18 MED ORDER — PROPOFOL 10 MG/ML IV BOLUS
INTRAVENOUS | Status: AC
  Filled 2017-08-18: qty 20

## 2017-08-18 MED ORDER — SUCCINYLCHOLINE CHLORIDE 20 MG/ML IJ SOLN
INTRAMUSCULAR | Status: AC
Start: 2017-08-18 — End: 2017-08-18
  Filled 2017-08-18: qty 1

## 2017-08-18 MED ORDER — SODIUM CHLORIDE 0.9 % IV SOLN
80.0000 mL/h | INTRAVENOUS | Status: DC
  Administered 2017-08-18 – 2017-08-22 (×9): 80 mL/h via INTRAVENOUS

## 2017-08-18 MED ORDER — CELECOXIB 200 MG PO CAPS
200.0000 mg | ORAL_CAPSULE | Freq: Two times a day (BID) | ORAL | Status: DC
  Administered 2017-08-19 – 2017-08-22 (×7): 200 mg via ORAL
  Filled 2017-08-18 (×9): qty 1

## 2017-08-18 MED ORDER — PROPOFOL 500 MG/50ML IV EMUL
INTRAVENOUS | Status: AC
  Filled 2017-08-18: qty 50

## 2017-08-18 MED ORDER — EPHEDRINE SULFATE 50 MG/ML IJ SOLN
INTRAMUSCULAR | Status: DC | PRN
  Administered 2017-08-18 (×2): 5 mg via INTRAVENOUS

## 2017-08-18 MED ORDER — BUPIVACAINE-EPINEPHRINE 0.5% -1:200000 IJ SOLN
INTRAMUSCULAR | Status: DC | PRN
  Administered 2017-08-18: 30 mL

## 2017-08-18 MED ORDER — ENOXAPARIN SODIUM 40 MG/0.4ML ~~LOC~~ SOLN
40.0000 mg | Freq: Once | SUBCUTANEOUS | Status: AC
  Administered 2017-08-18: 40 mg via SUBCUTANEOUS
  Filled 2017-08-18: qty 0.4

## 2017-08-18 MED ORDER — FENTANYL CITRATE (PF) 100 MCG/2ML IJ SOLN
25.0000 ug | INTRAMUSCULAR | Status: DC | PRN
  Administered 2017-08-18 (×4): 25 ug via INTRAVENOUS

## 2017-08-18 MED ORDER — PROPOFOL 10 MG/ML IV BOLUS
INTRAVENOUS | Status: DC | PRN
  Administered 2017-08-18: 180 mg via INTRAVENOUS

## 2017-08-18 MED ORDER — FENTANYL CITRATE (PF) 100 MCG/2ML IJ SOLN
INTRAMUSCULAR | Status: DC | PRN
  Administered 2017-08-18: 50 ug via INTRAVENOUS
  Administered 2017-08-18: 25 ug via INTRAVENOUS
  Administered 2017-08-18: 50 ug via INTRAVENOUS
  Administered 2017-08-18: 25 ug via INTRAVENOUS
  Administered 2017-08-18: 50 ug via INTRAVENOUS
  Administered 2017-08-18: 100 ug via INTRAVENOUS

## 2017-08-18 MED ORDER — LIDOCAINE HCL (CARDIAC) 20 MG/ML IV SOLN
INTRAVENOUS | Status: DC | PRN
  Administered 2017-08-18: 100 mg via INTRAVENOUS

## 2017-08-18 MED ORDER — SUGAMMADEX SODIUM 500 MG/5ML IV SOLN
INTRAVENOUS | Status: AC
  Filled 2017-08-18: qty 5

## 2017-08-18 MED ORDER — ALVIMOPAN 12 MG PO CAPS
12.0000 mg | ORAL_CAPSULE | ORAL | Status: AC
  Administered 2017-08-18: 12 mg via ORAL

## 2017-08-18 MED ORDER — GABAPENTIN 300 MG PO CAPS
300.0000 mg | ORAL_CAPSULE | ORAL | Status: AC
  Administered 2017-08-18: 300 mg via ORAL

## 2017-08-18 MED ORDER — DEXAMETHASONE SODIUM PHOSPHATE 10 MG/ML IJ SOLN
INTRAMUSCULAR | Status: DC | PRN
  Administered 2017-08-18: 10 mg via INTRAVENOUS

## 2017-08-18 MED ORDER — FENTANYL CITRATE (PF) 250 MCG/5ML IJ SOLN
INTRAMUSCULAR | Status: AC
  Filled 2017-08-18: qty 5

## 2017-08-18 MED ORDER — LIDOCAINE HCL (PF) 2 % IJ SOLN
INTRAMUSCULAR | Status: AC
  Filled 2017-08-18: qty 10

## 2017-08-18 MED ORDER — GABAPENTIN 300 MG PO CAPS
ORAL_CAPSULE | ORAL | Status: AC
Start: 2017-08-18 — End: 2017-08-18
  Filled 2017-08-18: qty 1

## 2017-08-18 MED ORDER — FAMOTIDINE 20 MG PO TABS
10.0000 mg | ORAL_TABLET | Freq: Two times a day (BID) | ORAL | Status: DC
  Administered 2017-08-19 – 2017-08-22 (×7): 10 mg via ORAL
  Filled 2017-08-18 (×8): qty 1

## 2017-08-18 MED ORDER — FENTANYL CITRATE (PF) 100 MCG/2ML IJ SOLN
INTRAMUSCULAR | Status: AC
  Filled 2017-08-18: qty 2

## 2017-08-18 MED ORDER — SUCCINYLCHOLINE CHLORIDE 20 MG/ML IJ SOLN
INTRAMUSCULAR | Status: DC | PRN
  Administered 2017-08-18: 120 mg via INTRAVENOUS

## 2017-08-18 MED ORDER — HYDROCODONE-ACETAMINOPHEN 5-325 MG PO TABS
1.0000 | ORAL_TABLET | ORAL | Status: DC | PRN
  Administered 2017-08-18 – 2017-08-20 (×4): 1 via ORAL
  Filled 2017-08-18 (×5): qty 1

## 2017-08-18 MED ORDER — BUPIVACAINE LIPOSOME 1.3 % IJ SUSP
INTRAMUSCULAR | Status: AC
  Filled 2017-08-18: qty 20

## 2017-08-18 MED ORDER — SODIUM CHLORIDE 0.9 % IV SOLN
2.0000 g | Freq: Two times a day (BID) | INTRAVENOUS | Status: AC
  Administered 2017-08-18: 2 g via INTRAVENOUS
  Filled 2017-08-18: qty 2

## 2017-08-18 SURGICAL SUPPLY — 53 items
ADH SKN CLS APL DERMABOND .7 (GAUZE/BANDAGES/DRESSINGS) ×1
BLADE CLIPPER SURG (BLADE) ×2 IMPLANT
BLADE SURG 15 STRL LF DISP TIS (BLADE) ×2 IMPLANT
BLADE SURG 15 STRL SS (BLADE) ×6
BLADE SURG SZ10 CARB STEEL (BLADE) ×6 IMPLANT
CHLORAPREP W/TINT 26ML (MISCELLANEOUS) ×6 IMPLANT
DEFOGGER SCOPE WARMER CLEARIFY (MISCELLANEOUS) ×3 IMPLANT
DERMABOND ADVANCED (GAUZE/BANDAGES/DRESSINGS) ×2
DERMABOND ADVANCED .7 DNX12 (GAUZE/BANDAGES/DRESSINGS) ×1 IMPLANT
DRAPE LAPAROTOMY 100X77 ABD (DRAPES) ×2 IMPLANT
DRSG OPSITE POSTOP 4X6 (GAUZE/BANDAGES/DRESSINGS) ×2 IMPLANT
DRSG OPSITE POSTOP 4X8 (GAUZE/BANDAGES/DRESSINGS) IMPLANT
ELECT CAUTERY BLADE 6.4 (BLADE) ×3 IMPLANT
ELECT REM PT RETURN 9FT ADLT (ELECTROSURGICAL) ×3
ELECTRODE REM PT RTRN 9FT ADLT (ELECTROSURGICAL) ×1 IMPLANT
GELPORT LAPAROSCOPIC (MISCELLANEOUS) ×3 IMPLANT
GLOVE BIOGEL M 6.5 STRL (GLOVE) ×18 IMPLANT
GLOVE SURG SYN 7.5  E (GLOVE) ×4
GLOVE SURG SYN 7.5 E (GLOVE) ×2 IMPLANT
GLOVE SURG SYN 7.5 PF PI (GLOVE) ×2 IMPLANT
GOWN STRL REUS W/ TWL LRG LVL3 (GOWN DISPOSABLE) ×6 IMPLANT
GOWN STRL REUS W/TWL LRG LVL3 (GOWN DISPOSABLE) ×18
HANDLE YANKAUER SUCT BULB TIP (MISCELLANEOUS) ×3 IMPLANT
IRRIGATION STRYKERFLOW (MISCELLANEOUS) ×1 IMPLANT
IRRIGATOR STRYKERFLOW (MISCELLANEOUS) ×3
KIT TURNOVER KIT A (KITS) ×3 IMPLANT
NDL SAFETY 22GX1.5 (NEEDLE) ×3 IMPLANT
PACK BASIN MAJOR ARMC (MISCELLANEOUS) ×3 IMPLANT
PACK COLON CLEAN CLOSURE (MISCELLANEOUS) ×3 IMPLANT
RELOAD PROXIMATE 75MM BLUE (ENDOMECHANICALS) ×18 IMPLANT
RELOAD STAPLE 75 3.8 BLU REG (ENDOMECHANICALS) ×4 IMPLANT
SHEARS HARMONIC ACE PLUS 36CM (ENDOMECHANICALS) ×3 IMPLANT
SLEEVE ENDOPATH XCEL 5M (ENDOMECHANICALS) ×6 IMPLANT
SOL .9 NS 3000ML IRR  AL (IV SOLUTION) ×2
SOL .9 NS 3000ML IRR AL (IV SOLUTION) ×1
SOL .9 NS 3000ML IRR UROMATIC (IV SOLUTION) ×1 IMPLANT
SPONGE LAP 18X18 5 PK (GAUZE/BANDAGES/DRESSINGS) ×6 IMPLANT
STAPLER GUN LINEAR PROX 60 (STAPLE) ×2 IMPLANT
STAPLER PROXIMATE 75MM BLUE (STAPLE) ×3 IMPLANT
SURGILUBE 2OZ TUBE FLIPTOP (MISCELLANEOUS) ×3 IMPLANT
SUT MNCRL 4-0 (SUTURE) ×3
SUT MNCRL 4-0 27XMFL (SUTURE) ×1
SUT PDS AB 1 TP1 54 (SUTURE) ×6 IMPLANT
SUT SILK 3-0 (SUTURE) ×3 IMPLANT
SUT VIC AB 3-0 SH 27 (SUTURE) ×15
SUT VIC AB 3-0 SH 27X BRD (SUTURE) ×5 IMPLANT
SUTURE MNCRL 4-0 27XMF (SUTURE) ×1 IMPLANT
SYR 30ML LL (SYRINGE) ×3 IMPLANT
SYR BULB IRRIG 60ML STRL (SYRINGE) ×3 IMPLANT
TOWEL OR 17X26 4PK STRL BLUE (TOWEL DISPOSABLE) ×3 IMPLANT
TRAY FOLEY W/METER SILVER 16FR (SET/KITS/TRAYS/PACK) ×3 IMPLANT
TROCAR XCEL NON-BLD 5MMX100MML (ENDOMECHANICALS) ×3 IMPLANT
TUBING INSUF HEATED (TUBING) ×2 IMPLANT

## 2017-08-18 NOTE — Op Note (Signed)
Preoperative diagnosis: Carcinoma of ascending colon, Umbilical hernia  Postoperative diagnosis: Carcinoma of ascending colon, Umbilical hernia  Procedure: Laparoscopic right hemicolectomy with primary anastomosis.                       Umbilical hernia repair  Anesthesia: GETA  Surgeon: Dr. Windell Moment, MD  Assistant Surgeon: Dr. Dahlia Byes  Wound Classification: Clean Contaminated  Indications: Patient is a 62 y.o. male with symptoms of rectal bleeding was found to have large tubular adenoma polyp unable to be resected by colonoscopy. Extended right hemicolectomy resection was indicated for oncologic treatment.   Findings: 1. Tattoo seen on the hepatic flexure  2. Tension free anastomosis 3. Adequate gross circulation on anastomosis 4. No gross metastatic disease identified 5. Right ureter and duodenum identified and preserved.  6. Adequate hemostasis  Details of operation: The patient was brought to the operating room and placed on the operating table in the supine position. The abdomen was clipped, prepped with 2% chlorhexidine solution, and draped in the standard fashion. A time-out was completed verifying correct patient, procedure, site, positioning, and implant(s) and/or special equipment prior to beginning this procedure. A vertical 5 cm periumbilical incision was used to access the peritoneal cavity and a Gelport was placed.  Under direct visualization a 30mm epigastric trocar was introduced and pneumoperitoneum was achieved. Another 5 mm trocar was introduced on the left upper quadrant. The patient was then positioned in 30 reverse Trendelenburg position with the patient's right side up to allow for small bowel to drop clear of the operative field. The 5-mm scope was introduced from the epigastric port at the initial part of the procedure. With a hand assisted technique, the cecum was grasped with the left hand of the operating surgeon and lifted up to tent the ileocolic artery. The  dissection was then started at the base of the ileocolic artery where it joined the superior mesenteric artery. The peritoneum overlying the takeoff of the ileocolic artery was opened using a Harmonic scapel. This opening of the peritoneum was carried all the way up to the base of the transverse mesocolon. With the ileocolic artery retracted by traction on the cecum, gentle dissection was used to identify its takeoff from the superior mesenteric artery. Dissection proceeded in the retroperitoneal using a traction counter-traction technique with gentle sweeping and visualization of the ureter. This dissection is carried along the plane of the Toldt fascia to the duodenum. The Duodenum and its attachments were swept downward. The C loop of the duodenum as well as a head of the pancreas were fully visualized with dissected and preserved. This dissection was continued in the avascular plane until the liver or gallbladder were encountered. The lateral attachments of the ascending colon were then divided including mobilization of the retroperitoneal attachments of the terminal ileum. This mobilization was carried out until the posterior plane of dissection that was started from the medial approach was encountered, completing the liberation of the ascending colon and its hepatic flexure. At this point, the terminal ileum, the ascending colon and the Transverse colon was exteriorized through the midline wound.  The ileocolic artery and vein were then dissected and divided using Harmonic device. Dissection then continued on the right side of the superior mesenteric artery leading to the identification of the right colic artery and vein which were then gently dissected and divided.  Due to the complexity of the extended dissection for the hepatic flexure polyp, Dr. Dahlia Byes assisted in the exposure and division  of the small an large intestine. He also assisted in the exposure of the mesenteric division and the side to side  anastomosis. The small bowel and colon were then divided extracorporeally using a GIA device. The mesenteric dissection was completed as needed on the small bowel and colonic sides and the specimen resection was thus completed. The specimen was removed. The two ends of the ileum and transverse colon were then aligned, ensuring that the mesentery was not twisted, and a stapled side to side anastomosis was created in the usual manner. The mesenteric defect was then closed with Vicryl 3-0 sutures and the bowel reintroduced into the peritoneal cavity. The abdominal cavity was again insufflated and inspected. Anastomosis was seen without tension, and no bleeding identified. Omentum spread above the anastomosis and small bowel. The trocars were removed under direct vision. At this point, the Surgeon and the tech changed to gloves and the gown for a clean closure. The Umbilical hernia had pre peritoneal fat incarcerated. The sac was opened and content removed. The fascia was then closed with PDS 0.  The skin was closed with 4-0 Monocryl sutures in a running fashion and a dry sterile dressing was applied. The sponge and instrument count was correct, blood loss was minimal, and there were no complications. The patient tolerated the procedure well and was transferred to the post-anesthesia care unit in stable condition.   Specimen: Right Colon  Complications: None  Estimated Blood Loss: 92mL

## 2017-08-18 NOTE — Anesthesia Preprocedure Evaluation (Signed)
Anesthesia Evaluation  Patient identified by MRN, date of birth, ID band Patient awake    Reviewed: Allergy & Precautions, H&P , NPO status , Patient's Chart, lab work & pertinent test results  History of Anesthesia Complications (+) DIFFICULT AIRWAY and history of anesthetic complications  Airway Mallampati: III  TM Distance: <3 FB Neck ROM: full    Dental  (+) Chipped, Poor Dentition   Pulmonary neg shortness of breath, asthma , former smoker,           Cardiovascular Exercise Tolerance: Good (-) angina(-) Past MI and (-) DOE negative cardio ROS       Neuro/Psych negative neurological ROS  negative psych ROS   GI/Hepatic negative GI ROS, Neg liver ROS, neg GERD  ,  Endo/Other  Hyperthyroidism   Renal/GU      Musculoskeletal   Abdominal   Peds  Hematology negative hematology ROS (+)   Anesthesia Other Findings Past Medical History: No date: Asthma     Comment:  as a child 2012: Difficult intubation     Comment:  difficulty with intubation; had to use glide scope 20 years: Hyperthyroidism     Comment:  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 did not need               surgery or treatment, pt has not had any medication for               this condition since then 05/19/2011: Incarcerated inguinal hernia No date: Lyme disease  Past Surgical History: No date: CYST REMOVAL HAND; Left     Comment:  index finger left hand 1982: HERNIA REPAIR     Comment:  repaired hernia in upper abd 05/21/2011: INGUINAL HERNIA REPAIR     Comment:  Procedure: HERNIA REPAIR INGUINAL ADULT;  Surgeon: Stark Klein, MD;  Location: [facility];  Service: General;                Laterality: Right; 2009: ROTATOR CUFF REPAIR; Right     Comment:  Dr Latanya Maudlin No date: TONSILLECTOMY  BMI    Body Mass Index:  32.72 kg/m      Reproductive/Obstetrics negative OB ROS                              Anesthesia Physical Anesthesia Plan  ASA: III  Anesthesia Plan: General ETT   Post-op Pain Management:    Induction: Intravenous  PONV Risk Score and Plan: Ondansetron, Dexamethasone and Midazolam  Airway Management Planned: Oral ETT  Additional Equipment:   Intra-op Plan:   Post-operative Plan: Extubation in OR  Informed Consent: I have reviewed the patients History and Physical, chart, labs and discussed the procedure including the risks, benefits and alternatives for the proposed anesthesia with the patient or authorized representative who has indicated his/her understanding and acceptance.   Dental Advisory Given  Plan Discussed with: Anesthesiologist, CRNA and Surgeon  Anesthesia Plan Comments: (Patient consented for risks of anesthesia including but not limited to:  - adverse reactions to medications - damage to teeth, lips or other oral mucosa - sore throat or hoarseness - Damage to heart, brain, lungs or loss of life  Patient voiced understanding.)        Anesthesia Quick Evaluation

## 2017-08-18 NOTE — Anesthesia Post-op Follow-up Note (Signed)
Anesthesia QCDR form completed.        

## 2017-08-18 NOTE — Transfer of Care (Signed)
Immediate Anesthesia Transfer of Care Note  Patient: Michael Love  Procedure(s) Performed: LAPAROSCOPIC RIGHT HEMI COLECTOMY (Right Abdomen)  Patient Location: PACU  Anesthesia Type:General  Level of Consciousness: awake, alert  and oriented  Airway & Oxygen Therapy: Patient Spontanous Breathing and Patient connected to face mask oxygen  Post-op Assessment: Report given to RN and Post -op Vital signs reviewed and stable  Post vital signs: Reviewed and stable  Last Vitals:  Vitals:   08/18/17 0618 08/18/17 1139  BP: (!) 139/97 137/85  Pulse: 75 98  Resp: 18 19  Temp: 37.1 C 37.4 C  SpO2: 98%     Last Pain:  Vitals:   08/18/17 1139  TempSrc: Temporal  PainSc:          Complications: No apparent anesthesia complications

## 2017-08-18 NOTE — Anesthesia Postprocedure Evaluation (Signed)
Anesthesia Post Note  Patient: Michael Love  Procedure(s) Performed: LAPAROSCOPIC RIGHT HEMI COLECTOMY (Right Abdomen)  Patient location during evaluation: PACU Anesthesia Type: General Level of consciousness: awake and alert Pain management: pain level controlled Vital Signs Assessment: post-procedure vital signs reviewed and stable Respiratory status: spontaneous breathing, nonlabored ventilation, respiratory function stable and patient connected to nasal cannula oxygen Cardiovascular status: blood pressure returned to baseline and stable Postop Assessment: no apparent nausea or vomiting Anesthetic complications: no     Last Vitals:  Vitals:   08/18/17 1240 08/18/17 1319  BP: 131/84 140/87  Pulse: 91 81  Resp: 13 12  Temp: 37.3 C 37 C  SpO2: 96% 95%    Last Pain:  Vitals:   08/18/17 1319  TempSrc: Oral  PainSc:                  [name]

## 2017-08-18 NOTE — Anesthesia Procedure Notes (Signed)
Procedure Name: Intubation Date/Time: 08/18/2017 7:23 AM Performed by: Hedda Slade, CRNA Pre-anesthesia Checklist: Patient identified, Patient being monitored, Timeout performed, Emergency Drugs available and Suction available Patient Re-evaluated:Patient Re-evaluated prior to induction Oxygen Delivery Method: Circle system utilized Preoxygenation: Pre-oxygenation with 100% oxygen Induction Type: IV induction Ventilation: Mask ventilation without difficulty Laryngoscope Size: McGraph and 4 Grade View: Grade II Tube type: Oral Tube size: 7.5 mm Number of attempts: 1 Airway Equipment and Method: Stylet Placement Confirmation: ETT inserted through vocal cords under direct vision,  positive ETCO2 and breath sounds checked- equal and bilateral Secured at: 22 cm Tube secured with: Tape Dental Injury: Teeth and Oropharynx as per pre-operative assessment  Difficulty Due To: Difficulty was anticipated and Difficult Airway- due to anterior larynx Future Recommendations: Recommend- induction with short-acting agent, and alternative techniques readily available

## 2017-08-18 NOTE — Interval H&P Note (Signed)
History and Physical Interval Note:  08/18/2017 6:57 AM  Michael Love  has presented today for surgery, with the diagnosis of TUBULAR ADENOMA OF COLON, UNSPECIFIED  The various methods of treatment have been discussed with the patient and family. After consideration of risks, benefits and other options for treatment, the patient has consented to  Procedure(s): LAPAROSCOPIC RIGHT HEMI COLECTOMY (Right) as a surgical intervention .  The patient's history has been reviewed, patient examined, no change in status, stable for surgery.  I have reviewed the patient's chart and labs.  Correct site marked in the pre procedure room. Questions were answered to the patient's satisfaction.     Herbert Pun

## 2017-08-19 LAB — CBC
HCT: 42 % (ref 40.0–52.0)
Hemoglobin: 13.8 g/dL (ref 13.0–18.0)
MCH: 29.7 pg (ref 26.0–34.0)
MCHC: 32.8 g/dL (ref 32.0–36.0)
MCV: 90.8 fL (ref 80.0–100.0)
PLATELETS: 262 10*3/uL (ref 150–440)
RBC: 4.63 MIL/uL (ref 4.40–5.90)
RDW: 14.1 % (ref 11.5–14.5)
WBC: 11.6 10*3/uL — ABNORMAL HIGH (ref 3.8–10.6)

## 2017-08-19 LAB — BASIC METABOLIC PANEL
Anion gap: 8 (ref 5–15)
BUN: 10 mg/dL (ref 6–20)
CALCIUM: 8.8 mg/dL — AB (ref 8.9–10.3)
CO2: 27 mmol/L (ref 22–32)
CREATININE: 1.1 mg/dL (ref 0.61–1.24)
Chloride: 106 mmol/L (ref 101–111)
GFR calc Af Amer: >60 mL/min (ref >60)
GFR calc non Af Amer: >60 mL/min (ref >60)
Glucose, Bld: 119 mg/dL — ABNORMAL HIGH (ref 65–99)
Potassium: 4.7 mmol/L (ref 3.5–5.1)
SODIUM: 141 mmol/L (ref 135–145)

## 2017-08-19 LAB — SURGICAL PATHOLOGY

## 2017-08-19 NOTE — Progress Notes (Signed)
Patient ID: Michael Love, male   DOB: July 04, 1955, 62 y.o.   MRN: [id number]     [place] Hospital Day(s): 1.   Post op day(s): 1 Day Post-Op.   Interval History: Patient seen and examined, no acute events or new complaints overnight. Patient reports sharp pain on the right lower quadrant. Refers only drank water and sat on bed. Denies nausea and vomiting, denies standing from bed.  Vital signs in last 24 hours: [min-max] current  Temp:  [97.8 F (36.6 C)-99.3 F (37.4 C)] 97.8 F (36.6 C) (03/05 0450) Pulse Rate:  [76-100] 76 (03/05 0450) Resp:  [12-20] 20 (03/05 0450) BP: (131-179)/(81-95) 179/95 (03/05 0450) SpO2:  [91 %-98 %] 97 % (03/05 0450)     Height: 6\' 1"  (185.4 cm) Weight: 112.5 kg (248 lb) BMI (Calculated): 32.73    Physical Exam:  Constitutional: alert, cooperative and no distress  Respiratory: breathing non-labored at rest  Cardiovascular: regular rate and sinus rhythm  Gastrointestinal: soft, non-tender, and non-distended, wounds dry and clean.   Labs:  CBC Latest Ref Rng & Units 08/19/2017 08/12/2017 05/19/2011  WBC 3.8 - 10.6 K/uL 11.6(H) 8.0 7.2  Hemoglobin 13.0 - 18.0 g/dL 13.8 15.5 14.1  Hematocrit 40.0 - 52.0 % 42.0 45.8 39.9  Platelets 150 - 440 K/uL 262 306 288   CMP Latest Ref Rng & Units 08/19/2017 08/12/2017 07/15/2017  Glucose 65 - 99 mg/dL 119(H) 91 -  BUN 6 - 20 mg/dL 10 10 -  Creatinine 0.61 - 1.24 mg/dL 1.10 0.80 1.10  Sodium 135 - 145 mmol/L 141 139 -  Potassium 3.5 - 5.1 mmol/L 4.7 3.6 -  Chloride 101 - 111 mmol/L 106 106 -  CO2 22 - 32 mmol/L 27 23 -  Calcium 8.9 - 10.3 mg/dL 8.8(L) 9.0 -  Total Protein 6.0 - 8.3 g/dL - - -  Total Bilirubin 0.3 - 1.2 mg/dL - - -  Alkaline Phos 39 - 117 U/L - - -  AST 0 - 37 U/L - - -  ALT 0 - 53 U/L - - -    Assessment/Plan:  62 y.o. male with unresectable polyp of the hepatic flexure 1 Day Post-Op s/p lap assisted extended right hemicolectomy. Patient unable to walk yesterday and only  drank water. Will keep clear liquid diet in AM and assess for toleration, maybe advance in the afternoon. Encourage to get out of bed today. Adequate WBC count, Hemoglobin and electrolytes. Will order removal of foley. Will continue with pain management for better pain control. Patient doing incentive spirometer.    All of the above findings and recommendations were discussed with the patient, and the medical team, and all of patient's questions were answered to his expressed satisfaction.  Arnold Long, MD

## 2017-08-20 MED ORDER — OXYCODONE-ACETAMINOPHEN 5-325 MG PO TABS
1.0000 | ORAL_TABLET | ORAL | Status: DC | PRN
  Administered 2017-08-21 – 2017-08-22 (×6): 1 via ORAL
  Filled 2017-08-20 (×6): qty 1

## 2017-08-20 MED ORDER — AMLODIPINE BESYLATE 5 MG PO TABS
5.0000 mg | ORAL_TABLET | Freq: Every day | ORAL | Status: DC
  Administered 2017-08-20 – 2017-08-22 (×3): 5 mg via ORAL
  Filled 2017-08-20 (×3): qty 1

## 2017-08-20 NOTE — Progress Notes (Signed)
Patient ID: Michael Love, male   DOB: 1956/06/04, 62 y.o.   MRN: [id number]     [place] Hospital Day(s): 2.   Post op day(s): 2 Days Post-Op.   Interval History: Patient seen and examined, no acute events or new complaints overnight. Patient reports having watery bowel movement and gasses this morning, refers mild nausea. Still with pain in need of IV pain medications.  Vital signs in last 24 hours: [min-max] current  Temp:  [97.7 F (36.5 C)-98.9 F (37.2 C)] 97.7 F (36.5 C) (03/06 1255) Pulse Rate:  [72-79] 72 (03/06 1255) Resp:  [16-18] 18 (03/06 1255) BP: (152-157)/(92-97) 157/92 (03/06 1255) SpO2:  [93 %-96 %] 96 % (03/06 1255)     Height: 6\' 1"  (185.4 cm) Weight: 112.5 kg (248 lb) BMI (Calculated): 32.73   Physical Exam:  Constitutional: alert, cooperative and no distress  Respiratory: breathing non-labored at rest  Cardiovascular: regular rate and sinus rhythm  Gastrointestinal: soft, moderate-tender, and distended. Wound dry and clean.   Labs:  CBC Latest Ref Rng & Units 08/19/2017 08/12/2017 05/19/2011  WBC 3.8 - 10.6 K/uL 11.6(H) 8.0 7.2  Hemoglobin 13.0 - 18.0 g/dL 13.8 15.5 14.1  Hematocrit 40.0 - 52.0 % 42.0 45.8 39.9  Platelets 150 - 440 K/uL 262 306 288   CMP Latest Ref Rng & Units 08/19/2017 08/12/2017 07/15/2017  Glucose 65 - 99 mg/dL 119(H) 91 -  BUN 6 - 20 mg/dL 10 10 -  Creatinine 0.61 - 1.24 mg/dL 1.10 0.80 1.10  Sodium 135 - 145 mmol/L 141 139 -  Potassium 3.5 - 5.1 mmol/L 4.7 3.6 -  Chloride 101 - 111 mmol/L 106 106 -  CO2 22 - 32 mmol/L 27 23 -  Calcium 8.9 - 10.3 mg/dL 8.8(L) 9.0 -  Total Protein 6.0 - 8.3 g/dL - - -  Total Bilirubin 0.3 - 1.2 mg/dL - - -  Alkaline Phos 39 - 117 U/L - - -  AST 0 - 37 U/L - - -  ALT 0 - 53 U/L - - -    Assessment/Plan: 62 y.o. male with unresectable polyp of the hepatic flexure 2 Day Post-Op s/p lap assisted extended right hemicolectomy. Patient with mild abdominal distention but has passed  multiple gasses. Will progress diet and assess for toleration. Still need to control pain better. Patient able to ambulate to the bathroom. Persistent high blood pressure. Will start Norvasc as taken at home. Voiding spontaneously, no fever, no tachycardia.    Arnold Long, MD

## 2017-08-21 NOTE — Progress Notes (Signed)
Patient ID: Michael Love, male   DOB: [birth year], 62 y.o.   MRN: [id number]     [facility] Hospital Day(s): 3.   Post op day(s): 3 Days Post-Op.   Interval History: Patient seen and examined, no acute events or new complaints overnight. Patient reports passed small amount of stool. Refers still with pain needing IV medications. Denies nausea or vomiting. Refers tolerating current diet  Vital signs in last 24 hours: [min-max] current  Temp:  [97.7 F (36.5 C)-98.6 F (37 C)] 98.6 F (37 C) (03/07 0517) Pulse Rate:  [72-76] 72 (03/07 0517) Resp:  [18] 18 (03/07 0517) BP: (133-157)/(78-92) 133/78 (03/07 0517) SpO2:  [94 %-96 %] 94 % (03/07 0517)     Height: 6\' 1"  (185.4 cm) Weight: 112.5 kg (248 lb) BMI (Calculated): 32.73    Physical Exam:  Constitutional: alert, cooperative and no distress  Respiratory: breathing non-labored at rest  Cardiovascular: regular rate and sinus rhythm  Gastrointestinal: soft, non-tender, and non-distended. Wounds dry and clean.   Labs:  CBC Latest Ref Rng & Units 08/19/2017 08/12/2017 05/19/2011  WBC 3.8 - 10.6 K/uL 11.6(H) 8.0 7.2  Hemoglobin 13.0 - 18.0 g/dL 13.8 15.5 14.1  Hematocrit 40.0 - 52.0 % 42.0 45.8 39.9  Platelets 150 - 440 K/uL 262 306 288   CMP Latest Ref Rng & Units 08/19/2017 08/12/2017 07/15/2017  Glucose 65 - 99 mg/dL 119(H) 91 -  BUN 6 - 20 mg/dL 10 10 -  Creatinine 0.61 - 1.24 mg/dL 1.10 0.80 1.10  Sodium 135 - 145 mmol/L 141 139 -  Potassium 3.5 - 5.1 mmol/L 4.7 3.6 -  Chloride 101 - 111 mmol/L 106 106 -  CO2 22 - 32 mmol/L 27 23 -  Calcium 8.9 - 10.3 mg/dL 8.8(L) 9.0 -  Total Protein 6.0 - 8.3 g/dL - - -  Total Bilirubin 0.3 - 1.2 mg/dL - - -  Alkaline Phos 39 - 117 U/L - - -  AST 0 - 37 U/L - - -  ALT 0 - 53 U/L - - -    Assessment/Plan:  62 y.o. male with unresectable polyp of the hepatic flexure 3 Day Post-Op s/p lap assisted extended right hemicolectomy. Patient recovering slowly. Still needing IV pain  medications to be able to control pain. Will try to wean to oral medications. Tolerating diet, will progress to soft diet. No tachycardia, no fever. Ambulating. Possible discharge tomorrow if pain in controlled.    Arnold Long, MD

## 2017-08-22 ENCOUNTER — Telehealth: Payer: Self-pay | Admitting: General Surgery

## 2017-08-22 MED ORDER — GABAPENTIN 300 MG PO CAPS: 300.0000 mg | ORAL_CAPSULE | Freq: Two times a day (BID) | ORAL | 0 refills | Status: DC

## 2017-08-22 MED ORDER — HYDROCODONE-ACETAMINOPHEN 5-325 MG PO TABS: 1.0000 | ORAL_TABLET | ORAL | 0 refills | Status: AC | PRN

## 2017-08-22 NOTE — Progress Notes (Addendum)
Patient ID: Michael Love, male   DOB: 07/11/1955, 62 y.o.   MRN: [id number]     [facility] Hospital Day(s): 4.   Post op day(s): 4 Days Post-Op.   Interval History: Patient seen and examined, no acute events or new complaints overnight. Patient reports continue passing gasses. Refers able to ambulate and that pain is better. Denies nausea or vomiting.  Vital signs in last 24 hours: [min-max] current  Temp:  [97.8 F (36.6 C)-98.3 F (36.8 C)] 98.3 F (36.8 C) (03/08 0426) Pulse Rate:  [92-98] 98 (03/08 0426) Resp:  [16-18] 18 (03/08 0426) BP: (150-162)/(86-91) 162/91 (03/08 0426) SpO2:  [95 %-96 %] 95 % (03/08 0426)     Height: 6\' 1"  (185.4 cm) Weight: 112.5 kg (248 lb) BMI (Calculated): 32.73    Physical Exam:  Constitutional: alert, cooperative and no distress  Respiratory: breathing non-labored at rest  Cardiovascular: regular rate and sinus rhythm  Gastrointestinal: soft, non-tender, and non-distended, wounds dry and clean.  Labs:  CBC Latest Ref Rng & Units 08/19/2017 08/12/2017 05/19/2011  WBC 3.8 - 10.6 K/uL 11.6(H) 8.0 7.2  Hemoglobin 13.0 - 18.0 g/dL 13.8 15.5 14.1  Hematocrit 40.0 - 52.0 % 42.0 45.8 39.9  Platelets 150 - 440 K/uL 262 306 288   CMP Latest Ref Rng & Units 08/19/2017 08/12/2017 07/15/2017  Glucose 65 - 99 mg/dL 119(H) 91 -  BUN 6 - 20 mg/dL 10 10 -  Creatinine 0.61 - 1.24 mg/dL 1.10 0.80 1.10  Sodium 135 - 145 mmol/L 141 139 -  Potassium 3.5 - 5.1 mmol/L 4.7 3.6 -  Chloride 101 - 111 mmol/L 106 106 -  CO2 22 - 32 mmol/L 27 23 -  Calcium 8.9 - 10.3 mg/dL 8.8(L) 9.0 -  Total Protein 6.0 - 8.3 g/dL - - -  Total Bilirubin 0.3 - 1.2 mg/dL - - -  Alkaline Phos 39 - 117 U/L - - -  AST 0 - 37 U/L - - -  ALT 0 - 53 U/L - - -    Assessment/Plan:  62 y.o.malewith unresectable polyp of the hepatic flexure3 Day Post-Ops/p lap assisted extended right hemicolectomy. Patient recovering properly. Continue having bowel movements and passing gas.  Patient ambulating, voiding spontaneously, tolerating diet, passing gas and pain controlled. No tachycardia, no fever. Patient oriented about post of care, post op diet and to return to clinic in 2 weeks. Blood pressure followed by cardiologist who started Norvasc and has follow up. Small amount of current chronic pain medications will be prescribed until patient sees primary care. Will discharge home today and follow up at clinic in 2 weeks.    Arnold Long, MD

## 2017-08-22 NOTE — Telephone Encounter (Signed)
Patient called reporting drainage from the wound. Enough to soak his shirt. Thin, bloody fluid described. No redness at the wound noted. Bowel movement after arrival home today. Feels a little "bloated" since this happened. No pain. Will cover wound, change dressing as needed. He was told this should be mostly resolved by the morning. Will call back if new concerns.

## 2017-08-22 NOTE — Progress Notes (Signed)
08/22/2017 08/22/2017  10:29 AM  Michael Love to be D/C'd Home per MD order.  Discussed prescriptions and follow up appointments with the patient. Prescriptions given to patient, medication list explained in detail. Pt verbalized understanding.  Allergies as of 08/22/2017   No Known Allergies     Medication List    TAKE these medications   clonazePAM 1 MG tablet Commonly known as:  KLONOPIN Take 1 mg by mouth daily as needed for anxiety.   gabapentin 300 MG capsule Commonly known as:  NEURONTIN Take 1 capsule (300 mg total) by mouth 2 (two) times daily for 15 days.   HYDROcodone-acetaminophen 5-325 MG tablet Commonly known as:  NORCO 1-2 tablets q 4-6 hrs prn pain. What changed:    how much to take  how to take this  when to take this  additional instructions   HYDROcodone-acetaminophen 5-325 MG tablet Commonly known as:  NORCO Take 1 tablet by mouth every 4 (four) hours as needed for up to 3 days for moderate pain. What changed:  You were already taking a medication with the same name, and this prescription was added. Make sure you understand how and when to take each.       Vitals:   08/21/17 1332 08/22/17 0426  BP: (!) 150/86 (!) 162/91  Pulse: 92 98  Resp: 16 18  Temp: 97.8 F (36.6 C) 98.3 F (36.8 C)  SpO2: 96% 95%    Skin clean, dry and intact without evidence of skin break down, no evidence of skin tears noted. IV catheter discontinued intact. Site without signs and symptoms of complications. Dressing and pressure applied. Pt denies pain at this time. No complaints noted.  An After Visit Summary was printed and given to the patient. Patient escorted via [place], and D/C home via private auto.  Dola Argyle

## 2017-08-22 NOTE — Discharge Instructions (Signed)
°  Diet: Resume soft low fiber diet for 2 weeks. After two weeks, start regular high fiber diet. It is preferred to have frequent small meals than having 3 big meals a day. Drink at least 8 glasses of water daily.  ° °Activity: No heavy lifting >10 pounds (children, pets, laundry, garbage) or strenuous activity for the next 6 weeks, but light activity and walking are encouraged.  ° °Do not drive for four weeks. ° °It is normal to feel tire most of the time since your body is using energy to heal.   ° °Wound care: May shower with soapy water and pat dry (do not rub incisions), but no baths or submerging incision underwater until follow-up. (no swimming)  ° °Do not smoke, since smoking delays the process of healing, among other negative effects.  ° °Call the office if the wound becomes red and start to drain pus.  ° °Medications: Resume all home medications. For mild to moderate pain: acetaminophen (Tylenol) or ibuprofen (if no kidney disease). Combining Tylenol with alcohol can substantially increase your risk of causing liver disease. Narcotic pain medications, if prescribed, can be used for severe pain, though may cause nausea, constipation, and drowsiness. Do not combine Tylenol and Norco within a 6 hour period as Norco contains Tylenol. If you do not need the narcotic pain medication, you do not need to fill the prescription. Do not drink alcohol while taking narcotics.  ° °Call office ([phone]) at any time if any questions, worsening pain, fevers/chills, bleeding, drainage from incision site, or other concerns. ° °

## 2017-08-22 NOTE — Discharge Summary (Signed)
Physician Discharge Summary  Patient ID: Michael Love MRN: [id number] DOB/AGE: 10-04-1955 62 y.o.  Admit date: 08/18/2017 Discharge date: 08/22/2017  Admission Diagnoses:   Adenomatous polyp of ascending colon   Essential Hypertension   Umbilical hernia  Discharge Diagnoses:  Active Problems:   Adenomatous polyp of ascending colon   Essential Hypertension   Umbilical hernia  Discharged Condition: good  Hospital Course: Patient underwent right extended hemicolectomy due to adenomatous polyp of ascending colon unable to be resected by Colonoscopy. Patient recovered slowly. Able to tolerate diet, ambulate, pain controlled and voiding spontaneously with stable vitals signs.   Consults: None  Significant Diagnostic Studies: None  Treatments: IV hydration and surgery: Laparoscopic hand assisted Right extended hemicolectomy  Discharge Exam: Blood pressure (!) 162/91, pulse 98, temperature 98.3 F (36.8 C), temperature source Oral, resp. rate 18, height 6\' 1"  (1.854 m), weight 112.5 kg (248 lb), SpO2 95 %. General appearance: alert and cooperative Resp: clear to auscultation bilaterally Cardio: S1, S2 normal GI: soft, non-tender; bowel sounds normal; no masses,  no organomegaly Incision/Wound:dry and clean. Resolving echymosis.   Disposition: 01-Home or Self Care    Meds ordered this encounter  Medications  . cefoTEtan (CEFOTAN) 2 g in sodium chloride 0.9 % 100 mL IVPB    Order Specific Question:   Antibiotic Indication:    Answer:   Surgical Prophylaxis  . acetaminophen (TYLENOL) 500 MG tablet    Lewis, Cindy   : cabinet override  . alvimopan (ENTEREG) 12 MG capsule    Lewis, Cindy   : cabinet override  . famotidine (PEPCID) 20 MG tablet    Lewis, Cindy   : cabinet override  . gabapentin (NEURONTIN) 300 MG capsule    Lewis, Cindy   : cabinet override  . DISCONTD: chlorhexidine (HIBICLENS) 4 % liquid 1 application  . acetaminophen (TYLENOL) tablet 1,000 mg  .  alvimopan (ENTEREG) capsule 12 mg  . gabapentin (NEURONTIN) capsule 300 mg  . enoxaparin (LOVENOX) injection 40 mg  . DISCONTD: lactated ringers infusion  . famotidine (PEPCID) tablet 20 mg  . DISCONTD: oxyCODONE (Oxy IR/ROXICODONE) immediate release tablet 5 mg  . DISCONTD: oxyCODONE (ROXICODONE) 5 MG/5ML solution 5 mg  . DISCONTD: fentaNYL (SUBLIMAZE) injection 25-50 mcg  . DISCONTD: bupivacaine liposome (EXPAREL 1.3%) in normal saline Optime  . DISCONTD: bupivacaine-EPINEPHrine (MARCAINE W/ EPI) 0.5% -1:200000 (with pres) injection  . clonazePAM (KLONOPIN) tablet 1 mg  . enoxaparin (LOVENOX) injection 40 mg  . 0.9 %  sodium chloride infusion  . cefoTEtan (CEFOTAN) 2 g in sodium chloride 0.9 % 100 mL IVPB    Order Specific Question:   Indication:    Answer:   Surgical Prophylaxis  . acetaminophen (TYLENOL) tablet 650 mg  . gabapentin (NEURONTIN) capsule 300 mg  . OR Linked Order Group   . ondansetron (ZOFRAN) tablet 4 mg   . ondansetron (ZOFRAN) injection 4 mg  . morphine 4 MG/ML injection 4 mg  . celecoxib (CELEBREX) capsule 200 mg  . DISCONTD: alvimopan (ENTEREG) capsule 12 mg  . famotidine (PEPCID) tablet 10 mg  . fentaNYL (SUBLIMAZE) 100 MCG/2ML injection    Hipolito Bayley   : cabinet override  . DISCONTD: HYDROcodone-acetaminophen (NORCO/VICODIN) 5-325 MG per tablet 1 tablet  . amLODipine (NORVASC) tablet 5 mg  . oxyCODONE-acetaminophen (PERCOCET/ROXICET) 5-325 MG per tablet 1 tablet    Signed: Herbert Pun 08/22/2017, 8:03 AM

## 2017-08-29 ENCOUNTER — Telehealth: Payer: Self-pay | Admitting: Licensed Clinical Social Worker

## 2017-08-29 NOTE — Telephone Encounter (Signed)
CSW contacted patient regarding his response to the EMMI call that was made in which he answered that he felt sad. CSW was able to reach patient this afternoon and patient stated he is doing well. He stated that he was hoping to be instantly better but understands that this is unrealistic. He is looking forward to when he is able to mow his yard and do various other things. Patient declined any resources at this time and is aware he can call his medical provider should he have any needs. Shela Leff MSW,LCSW [phone]

## 2017-09-15 DIAGNOSIS — G894 Chronic pain syndrome: Secondary | ICD-10-CM | POA: Diagnosis not present

## 2017-09-15 DIAGNOSIS — I10 Essential (primary) hypertension: Secondary | ICD-10-CM | POA: Diagnosis not present

## 2017-11-03 DIAGNOSIS — R112 Nausea with vomiting, unspecified: Secondary | ICD-10-CM | POA: Diagnosis not present

## 2017-11-03 DIAGNOSIS — J209 Acute bronchitis, unspecified: Secondary | ICD-10-CM | POA: Diagnosis not present

## 2017-11-18 DIAGNOSIS — W57XXXA Bitten or stung by nonvenomous insect and other nonvenomous arthropods, initial encounter: Secondary | ICD-10-CM | POA: Diagnosis not present

## 2017-11-18 DIAGNOSIS — R21 Rash and other nonspecific skin eruption: Secondary | ICD-10-CM | POA: Diagnosis not present

## 2017-12-05 DIAGNOSIS — D122 Benign neoplasm of ascending colon: Secondary | ICD-10-CM | POA: Diagnosis not present

## 2018-01-19 DIAGNOSIS — R531 Weakness: Secondary | ICD-10-CM | POA: Diagnosis not present

## 2018-01-19 DIAGNOSIS — Z8619 Personal history of other infectious and parasitic diseases: Secondary | ICD-10-CM | POA: Diagnosis not present

## 2018-01-19 DIAGNOSIS — R5383 Other fatigue: Secondary | ICD-10-CM | POA: Diagnosis not present

## 2018-02-03 DIAGNOSIS — H60331 Swimmer's ear, right ear: Secondary | ICD-10-CM | POA: Diagnosis not present

## 2018-02-19 ENCOUNTER — Other Ambulatory Visit: Payer: Self-pay

## 2018-02-19 ENCOUNTER — Encounter: Payer: Self-pay | Admitting: Psychiatry

## 2018-02-19 ENCOUNTER — Emergency Department
Admission: EM | Admit: 2018-02-19 | Discharge: 2018-02-19 | Disposition: A | Discharge status: Home / Self Care | Payer: 59 | Attending: Emergency Medicine | Admitting: Emergency Medicine

## 2018-02-19 DIAGNOSIS — F451 Undifferentiated somatoform disorder: Secondary | ICD-10-CM

## 2018-02-19 DIAGNOSIS — R0989 Other specified symptoms and signs involving the circulatory and respiratory systems: Secondary | ICD-10-CM | POA: Diagnosis not present

## 2018-02-19 DIAGNOSIS — J45909 Unspecified asthma, uncomplicated: Secondary | ICD-10-CM | POA: Insufficient documentation

## 2018-02-19 DIAGNOSIS — F410 Panic disorder [episodic paroxysmal anxiety] without agoraphobia: Secondary | ICD-10-CM | POA: Diagnosis not present

## 2018-02-19 DIAGNOSIS — Z87891 Personal history of nicotine dependence: Secondary | ICD-10-CM | POA: Diagnosis not present

## 2018-02-19 DIAGNOSIS — I447 Left bundle-branch block, unspecified: Secondary | ICD-10-CM | POA: Diagnosis not present

## 2018-02-19 DIAGNOSIS — F419 Anxiety disorder, unspecified: Secondary | ICD-10-CM | POA: Insufficient documentation

## 2018-02-19 DIAGNOSIS — I10 Essential (primary) hypertension: Secondary | ICD-10-CM | POA: Diagnosis not present

## 2018-02-19 DIAGNOSIS — R531 Weakness: Secondary | ICD-10-CM | POA: Insufficient documentation

## 2018-02-19 HISTORY — DX: Panic disorder (episodic paroxysmal anxiety): F41.0

## 2018-02-19 LAB — COMPREHENSIVE METABOLIC PANEL
ALBUMIN: 4.1 g/dL (ref 3.5–5.0)
ALT: 29 U/L (ref 0–44)
AST: 36 U/L (ref 15–41)
Alkaline Phosphatase: 46 U/L (ref 38–126)
Anion gap: 7 (ref 5–15)
BUN: 11 mg/dL (ref 8–23)
CHLORIDE: 106 mmol/L (ref 98–111)
CO2: 27 mmol/L (ref 22–32)
Calcium: 9.2 mg/dL (ref 8.9–10.3)
Creatinine, Ser: 0.81 mg/dL (ref 0.61–1.24)
GFR calc Af Amer: >60 mL/min (ref >60)
GFR calc non Af Amer: >60 mL/min (ref >60)
Glucose, Bld: 100 mg/dL — ABNORMAL HIGH (ref 70–99)
POTASSIUM: 4.4 mmol/L (ref 3.5–5.1)
Sodium: 140 mmol/L (ref 135–145)
Total Bilirubin: 1.4 mg/dL — ABNORMAL HIGH (ref 0.3–1.2)
Total Protein: 7.4 g/dL (ref 6.5–8.1)

## 2018-02-19 LAB — CBC WITH DIFFERENTIAL/PLATELET
BASOS ABS: 0.1 10*3/uL (ref 0–0.1)
BASOS PCT: 1 %
Eosinophils Absolute: 0.4 10*3/uL (ref 0–0.7)
Eosinophils Relative: 4 %
HEMATOCRIT: 49.8 % (ref 40.0–52.0)
HEMOGLOBIN: 17 g/dL (ref 13.0–18.0)
Lymphocytes Relative: 38 %
Lymphs Abs: 3.2 10*3/uL (ref 1.0–3.6)
MCH: 30.3 pg (ref 26.0–34.0)
MCHC: 34.1 g/dL (ref 32.0–36.0)
MCV: 89 fL (ref 80.0–100.0)
MONOS PCT: 7 %
Monocytes Absolute: 0.6 10*3/uL (ref 0.2–1.0)
NEUTROS ABS: 4.1 10*3/uL (ref 1.4–6.5)
NEUTROS PCT: 50 %
Platelets: 313 10*3/uL (ref 150–440)
RBC: 5.59 MIL/uL (ref 4.40–5.90)
RDW: 14 % (ref 11.5–14.5)
WBC: 8.5 10*3/uL (ref 3.8–10.6)

## 2018-02-19 LAB — URINALYSIS, COMPLETE (UACMP) WITH MICROSCOPIC
BACTERIA UA: NONE SEEN
Bilirubin Urine: NEGATIVE
GLUCOSE, UA: NEGATIVE mg/dL
Hgb urine dipstick: NEGATIVE
KETONES UR: NEGATIVE mg/dL
LEUKOCYTES UA: NEGATIVE
Nitrite: NEGATIVE
PROTEIN: NEGATIVE mg/dL
SQUAMOUS EPITHELIAL / LPF: NONE SEEN (ref 0–5)
Specific Gravity, Urine: 1.008 (ref 1.005–1.030)
pH: 8 (ref 5.0–8.0)

## 2018-02-19 LAB — TROPONIN I

## 2018-02-19 LAB — LACTIC ACID, PLASMA: Lactic Acid, Venous: 1 mmol/L (ref 0.5–1.9)

## 2018-02-19 MED ORDER — MIRTAZAPINE 15 MG PO TABS: 15.0000 mg | ORAL_TABLET | Freq: Every day | ORAL | 0 refills | Status: DC

## 2018-02-19 MED ORDER — SODIUM CHLORIDE 0.9 % IV BOLUS
1000.0000 mL | Freq: Once | INTRAVENOUS | Status: AC
  Administered 2018-02-19: 1000 mL via INTRAVENOUS

## 2018-02-19 NOTE — Consult Note (Signed)
[facility] Face-to-Face Psychiatry Consult   Reason for Consult: Consult for this 62 year old man who comes to the emergency room with multiple physical complaints that seem likely to be related to anxiety Referring Physician: Reita Cliche Patient Identification: Michael Love MRN:  [id number] Principal Diagnosis: Panic disorder Diagnosis:   Patient Active Problem List   Diagnosis Date Noted  . Panic disorder [F41.0] 02/19/2018  . 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 Adenomatous polyp of ascending colon [D12.2] 08/18/2017  . Incarcerated right inguinal hernia [K40.30] 05/19/2011    Total Time spent with patient: 1 hour  Subjective:   Michael Love is a 62 y.o. male patient admitted with "I am not feeling good".  HPI: Patient seen chart reviewed.  Spoke with emergency room doctor.  This is a 62 year old man who comes to the emergency room with complaints of weakness dizziness anxiety palpitations and a general feeling that he is "about to die".  Patient reports that he has these symptoms rather frequently pretty much every day at unpredictable intervals but it is getting worse recently.  He has been having these sorts of things on and off for 30 years but recently they have gotten quite a bit worse.  He feels pretty disabled by it.  He has difficulty sleeping at night.  He has difficulty performing any of his normal functions.  He completely denies any suicidal thought denies any homicidal thought denies any kind of psychotic symptoms.  He has been given a modest dose of clonazepam to be taken once a day as needed by his primary care doctor and has been using that more frequently but is still having frequent anxiety.  Patient describes himself as having a lot of chronic worries mostly related to his physical condition.  He starts the story of his medical problems 30 years ago when he was bit by a tick.  He feels in retrospect that he developed Lyme disease at the time and intermittently  suffered from various symptoms over the years.  Also says that more recently he had been diagnosed with lupus.  Medical history: Patient apparently was diagnosed with Lyme disease at one point and received 2 courses of doxycycline.  He has not pursued any other further treatment so far for Lyme disease.  He also says that he was diagnosed with lupus.  Substance abuse history: Denies any alcohol or drug abuse  Social history: Married.  Feels like he has good support at home.  Past Psychiatric History: Patient has been given some Klonopin as needed by his primary care doctor which is somewhat helpful but only for a short period of time.  He says that some years ago he was prescribed Paxil for similar symptoms.  He took it very briefly because he said it made him feel much worse and made him feel "practically suicidal" although he clarifies that he certainly never tried to kill himself.  Never been in a psychiatric hospital no history of suicide attempts or violence.  Risk to Self:   Risk to Others:   Prior Inpatient Therapy:   Prior Outpatient Therapy:    Past Medical History:  Past Medical History:  Diagnosis Date  . Asthma    as a child  . Difficult intubation 2012   difficulty with intubation; had to use glide scope  . 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  Incarcerated inguinal hernia 05/19/2011  . Lyme disease   . Panic disorder 02/19/2018    Past Surgical History:  Procedure Laterality Date  . CYST REMOVAL HAND Left    index finger left hand  . HERNIA REPAIR  1982   repaired hernia in upper abd  . INGUINAL HERNIA REPAIR  05/21/2011   Procedure: HERNIA REPAIR INGUINAL ADULT;  Surgeon: Stark Klein, MD;  Location: [facility];  Service: General;  Laterality: Right;  . LAPAROSCOPIC RIGHT HEMI COLECTOMY Right 08/18/2017   Procedure: LAPAROSCOPIC RIGHT HEMI COLECTOMY;   Surgeon: Herbert Pun, MD;  Location: [facility] ORS;  Service: General;  Laterality: Right;  . ROTATOR CUFF REPAIR Right 2009   Dr Latanya Maudlin  . TONSILLECTOMY     Family History:  Family History  Problem Relation Age of Onset  . Diabetes Mother   . Cancer Father   . Colon cancer Father   . Cancer Brother    Family Psychiatric  History: Patient's brother committed suicide.  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 Marital status: Married    Spouse name: Not on file  . Number of children: Not on file  . Years of education: Not on file  . Highest education level: Not on file  Occupational History  . Not on file  Social Needs  . Financial resource strain: Not on file  . Food insecurity:    Worry: Not on file    Inability: Not on file  . Transportation needs:    Medical: Not on file    Non-medical: Not on file  Tobacco Use  . Smoking status: Former Smoker    Types: Cigarettes  . Smokeless tobacco: Never Used  Substance and Sexual Activity  . 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 Drug use: No  . Sexual activity: Yes  Lifestyle  . Physical activity:    Days per week: Not on file    Minutes per session: Not on file  . Stress: Not on file  Relationships  . Social connections:    Talks on phone: Not on file    Gets together: Not on file    Attends religious service: Not on file    Active member of club or organization: Not on file    Attends meetings of clubs or organizations: Not on file    Relationship status: Not on file  Other Topics Concern  . Not on file  Social History Narrative  . Not on file   Additional Social History:    Allergies:   No Known Allergies  Labs:  Results for orders placed or performed during the hospital encounter of 02/19/18 (from the past 48 hour(s))  Comprehensive metabolic panel     Status: Abnormal   Collection Time: 02/19/18 12:15 PM  Result Value Ref Range   Sodium 140 135 - 145 mmol/L   Potassium 4.4 3.5 - 5.1 mmol/L   Chloride 106 98 - 111 mmol/L   CO2 27 22 - 32 mmol/L   Glucose, Bld 100 (H) 70 - 99 mg/dL   BUN 11 8 - 23 mg/dL   Creatinine, Ser 0.81 0.61 -  1.24 mg/dL   Calcium 9.2 8.9 - 10.3 mg/dL   Total Protein 7.4 6.5 - 8.1 g/dL   Albumin 4.1 3.5 - 5.0 g/dL   AST 36 15 - 41 U/L    Comment: HEMOLYSIS AT THIS LEVEL MAY AFFECT RESULT   ALT 29 0 - 44 U/L   Alkaline Phosphatase 46 38 - 126 U/L   Total Bilirubin 1.4 (H) 0.3 - 1.2 mg/dL    Comment: HEMOLYSIS AT THIS LEVEL MAY AFFECT RESULT   GFR calc non Af Amer >60 >60 mL/min   GFR calc Af Amer >60 >60 mL/min    Comment: (NOTE) The eGFR has been calculated using the CKD EPI equation. This calculation has not been validated in all clinical situations. eGFR's persistently <60 mL/min signify possible Chronic Kidney Disease.    Anion gap 7 5 - 15    Comment: Performed at [facility], [address] [place], [place] [address]  Troponin I     Status: None   Collection Time: 02/19/18 12:15 PM  Result Value Ref Range   Troponin I <0.03 <0.03 ng/mL    Comment: Performed at [facility], [address] [place], [place] [address]  Lactic acid, plasma     Status: None   Collection Time: 02/19/18 12:15 PM  Result Value Ref Range   Lactic Acid, Venous 1.0 0.5 - 1.9 mmol/L    Comment: Performed at [facility], [address] [place], [place] [address]  CBC with Differential     Status: None   Collection Time: 02/19/18 12:15 PM  Result Value Ref Range   WBC 8.5 3.8 - 10.6 K/uL   RBC 5.59 4.40 - 5.90 MIL/uL   Hemoglobin 17.0 13.0 - 18.0 g/dL   HCT 49.8 40.0 - 52.0 %   MCV 89.0 80.0 - 100.0 fL   MCH 30.3  26.0 - 34.0 pg   MCHC 34.1 32.0 - 36.0 g/dL   RDW 14.0 11.5 - 14.5 %   Platelets 313 150 - 440 K/uL   Neutrophils Relative % 50 %   Neutro Abs 4.1 1.4 - 6.5 K/uL   Lymphocytes Relative 38 %   Lymphs Abs 3.2 1.0 - 3.6 K/uL   Monocytes Relative 7 %   Monocytes Absolute 0.6 0.2 - 1.0 K/uL   Eosinophils Relative 4 %   Eosinophils Absolute 0.4 0 - 0.7 K/uL   Basophils Relative 1 %   Basophils Absolute 0.1 0 - 0.1 K/uL    Comment: Performed at [facility], [address] [place], [place] [address]  Urinalysis, Complete w Microscopic     Status: Abnormal   Collection Time: 02/19/18  1:13 PM  Result Value Ref Range   Color, Urine STRAW (A) YELLOW   APPearance CLEAR (A) CLEAR   Specific Gravity, Urine 1.008 1.005 - 1.030   pH 8.0 5.0 - 8.0   Glucose, UA NEGATIVE NEGATIVE mg/dL   Hgb urine dipstick NEGATIVE NEGATIVE   Bilirubin Urine NEGATIVE NEGATIVE   Ketones, ur NEGATIVE NEGATIVE mg/dL   Protein, ur NEGATIVE NEGATIVE mg/dL   Nitrite NEGATIVE NEGATIVE   Leukocytes, UA NEGATIVE NEGATIVE   RBC / HPF 0-5 0 - 5 RBC/hpf   WBC, UA 0-5 0 - 5 WBC/hpf   Bacteria, UA NONE SEEN NONE SEEN   Squamous Epithelial / LPF NONE SEEN 0 - 5    Comment: Performed at [facility], [address] [place], [place] [address]    No current facility-administered medications  for this encounter.    Current Outpatient Medications  Medication Sig Dispense Refill  . clonazePAM (KLONOPIN) 1 MG tablet Take 1 mg by mouth daily as needed for anxiety.    . gabapentin (NEURONTIN) 300 MG capsule Take 1 capsule (300 mg total) by mouth 2 (two) times daily for 15 days. 30 capsule 0  . HYDROcodone-acetaminophen (NORCO) 5-325 MG per tablet 1-2 tablets q 4-6 hrs prn pain. (Patient taking differently: Take 1 tablet by mouth 2 (two) times daily. ) 30 tablet 0  . mirtazapine (REMERON) 15 MG tablet Take 1 tablet (15 mg total) by mouth at bedtime. 30 tablet 0    Musculoskeletal: Strength & Muscle Tone:  decreased Gait & Station: unsteady Patient leans: N/A  Psychiatric Specialty Exam: Physical Exam  Nursing note and vitals reviewed. Constitutional: He appears well-developed and well-nourished.  HENT:  Head: Normocephalic and atraumatic.  Eyes: Pupils are equal, round, and reactive to light. Conjunctivae are normal.  Neck: Normal range of motion.  Cardiovascular: Regular rhythm and normal heart sounds.  Respiratory: Effort normal. No respiratory distress.  GI: Soft.  Musculoskeletal: Normal range of motion.  Neurological: He is alert.  Skin: Skin is warm and dry.  Psychiatric: His speech is normal and behavior is normal. Judgment and thought content normal. His mood appears anxious. Cognition and memory are normal.    Review of Systems  Constitutional: Positive for malaise/fatigue.  HENT: Negative.   Eyes: Negative.   Respiratory: Negative.   Cardiovascular: Negative.   Gastrointestinal: Negative.   Musculoskeletal: Negative.   Skin: Negative.   Neurological: Negative.   Psychiatric/Behavioral: Positive for depression. Negative for hallucinations, memory loss, substance abuse and suicidal ideas. The patient is nervous/anxious and has insomnia.     Blood pressure (!) 164/96, pulse 63, temperature 98.1 F (36.7 C), temperature source Oral, resp. rate 12, height '6\' 1"'  (1.854 m), weight 108.9 kg, SpO2 96 %.Body mass index is 31.66 kg/m.  General Appearance: Casual  Eye Contact:  Good  Speech:  Normal Rate  Volume:  Normal  Mood:  Anxious  Affect:  Congruent  Thought Process:  Coherent  Orientation:  Full (Time, Place, and Person)  Thought Content:  Logical, Rumination and Tangential  Suicidal Thoughts:  No  Homicidal Thoughts:  No  Memory:  Immediate;   Fair Recent;   Fair Remote;   Fair  Judgement:  Fair  Insight:  Fair  Psychomotor Activity:  Decreased  Concentration:  Attention Span: Fair  Recall:  [employer] of Knowledge:  Fair  Language:  Fair  Akathisia:  No   Handed:  Right  AIMS (if indicated):     Assets:  Desire for Improvement Housing Resilience Social Support  ADL's:  Intact  Cognition:  WNL  Sleep:        Treatment Plan Summary: Medication management and Plan Patient who presents with a great deal of anxiety.  Much of it focused on his somatic condition.  It is unclear to me whether there might be any underlying physiologic basis for some of his symptoms but in any case the level of anxiety suggests a diagnosis of somatic symptom disorder on top of what are probably panic attacks.  I explained to the patient that the treatment for this would involve a combination of psychotherapy and possibly medication.  I empathized with his previous poor experience with peroxide teen.  I told him I could not promise that any medicines would be free of side effects but that if he wanted  I would give him a prescription for mirtazapine 15 mg at night which can help with his sleep and also with his anxiety.  I strongly encouraged him to follow-up with his primary care doctor and also to look into finding a psychotherapist who can help with anxiety disorders.  Patient agrees to plan.  Case reviewed with ER doctor.  Prescription provided.  Disposition: No evidence of imminent risk to self or others at present.   Patient does not meet criteria for psychiatric inpatient admission. Supportive therapy provided about ongoing stressors.  Alethia Berthold, MD 02/19/2018 6:40 PM

## 2018-02-19 NOTE — ED Provider Notes (Signed)
Signout from Dr. Reita Cliche in this 62 year old male who is being evaluated by Dr. Weber Cooks for depression.  Patient may be discharged home pending Dr. Lasandra Beech evaluation.  Physical Exam  BP (!) 165/92   Pulse 61   Temp 98.1 F (36.7 C) (Oral)   Resp 12   Ht 6\' 1"  (1.854 m)   Wt 108.9 kg   SpO2 97%   BMI 31.66 kg/m  .[facility]  Physical Exam Patient at this time without any further complaints.  No distress.  Appears weak.  However without any focal neurological findings on gross exam. ED Course/Procedures     Procedures  MDM  Patient be discharged with mirtazapine.  To follow-up with primary care.       Orbie Pyo, MD 02/19/18 [phone]

## 2018-02-19 NOTE — ED Triage Notes (Signed)
Pt brought in by [facility] due to weakness x 24 hours. Pt has extensive hx of Lyme disease and wife believes this is related. Recent tick bite to left foot 2 months ago. Pt was placed on a 2 week course of Doxycycline and he finished 2 weeks ago. Decreased appetite. Has also had "weird feeling" in chest. Denies falls.

## 2018-02-19 NOTE — ED Provider Notes (Signed)
[facility] Emergency Department Provider Note ____________________________________________   I have reviewed the triage vital signs and the triage nursing note.  HISTORY  Chief Complaint Weakness   Historian Patient and spouse  HPI Michael Love is a 62 y.o. male presenting to the hospital feeling weak all over.  Started yesterday is been gradual in onset.  He says he fell asleep on the couch which is not unusual for him.  This morning he felt extremely jittery and felt like he is having constant panic attack.  He states the only thing that tends to help is when he takes Xanax.   He takes 1/day.  States that he took Paxil in the past but it made him suicidal.  States no suicidal ideation.      Past Medical History:  Diagnosis Date  . Asthma    as a child  . Difficult intubation 2012   difficulty with intubation; had to use glide scope  . 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 Incarcerated inguinal hernia 05/19/2011  . Lyme disease     Patient Active Problem List   Diagnosis Date Noted  . Adenomatous polyp of ascending colon 08/18/2017  . Incarcerated right inguinal hernia 05/19/2011    Past Surgical History:  Procedure Laterality Date  . CYST REMOVAL HAND Left    index finger left hand  . HERNIA REPAIR  1982   repaired hernia in upper abd  . INGUINAL HERNIA REPAIR  05/21/2011   Procedure: HERNIA REPAIR INGUINAL ADULT;  Surgeon: Stark Klein, MD;  Location: [facility];  Service: General;  Laterality: Right;  . LAPAROSCOPIC RIGHT HEMI COLECTOMY Right 08/18/2017   Procedure: LAPAROSCOPIC RIGHT HEMI COLECTOMY;  Surgeon: Herbert Pun, MD;  Location: [facility] ORS;  Service: General;  Laterality: Right;  . ROTATOR CUFF REPAIR Right 2009   Dr Latanya Maudlin  . TONSILLECTOMY      Prior to Admission medications   Medication  Sig Start Date End Date Taking? Authorizing Provider  clonazePAM (KLONOPIN) 1 MG tablet Take 1 mg by mouth daily as needed for anxiety. 05/06/17   [provider]  gabapentin (NEURONTIN) 300 MG capsule Take 1 capsule (300 mg total) by mouth 2 (two) times daily for 15 days. 08/22/17 09/06/17  Herbert Pun, MD  HYDROcodone-acetaminophen (NORCO) 5-325 MG per tablet 1-2 tablets q 4-6 hrs prn pain. Patient taking differently: Take 1 tablet by mouth 2 (two) times daily.  06/07/11   Stark Klein, MD    No Known Allergies  Family History  Problem Relation Age of Onset  . Diabetes Mother   . Cancer Father   . Colon cancer Father   . Cancer Brother     Social History Social History   Tobacco Use  . Smoking status: Former Smoker    Types: Cigarettes  . Smokeless tobacco: Never Used  Substance Use Topics  . 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 Drug use: No    Review of Systems  Constitutional: Negative for fever. Eyes: Negative for visual changes. ENT: Negative for sore throat. Cardiovascular: Negative for chest pain. Respiratory: Negative for shortness of breath. Gastrointestinal: Negative for abdominal pain, vomiting and diarrhea. Genitourinary: Negative for dysuria. Musculoskeletal: Negative for back pain. Skin: Negative for rash. Neurological: Occasional headaches.  ____________________________________________   PHYSICAL EXAM:  VITAL SIGNS: ED Triage Vitals  Enc Vitals Group     BP 02/19/18 1225 (!) 174/109     Pulse Rate 02/19/18 1225 (!) 59     Resp 02/19/18 1225 16     Temp 02/19/18 1225 98.1 F (36.7 C)     Temp Source 02/19/18 1225 Oral     SpO2 02/19/18 1225 97 %     Weight 02/19/18 1227 240 lb (108.9 kg)     Height 02/19/18 1227 6\' 1"  (1.854 m)     Head Circumference --      Peak Flow --      Pain Score 02/19/18 1226 2     Pain Loc --      Pain Edu? --      Excl. in [place]? --      Constitutional:  Alert and oriented.  HEENT      Head: Normocephalic and atraumatic.      Eyes: Conjunctivae are normal. Pupils equal and round.       Ears:         Nose: No congestion/rhinnorhea.      Mouth/Throat: Mucous membranes are moist.      Neck: No stridor. Cardiovascular/Chest: Normal rate, regular rhythm.  No murmurs, rubs, or gallops. Respiratory: Normal respiratory effort without tachypnea nor retractions. Breath sounds are clear and equal bilaterally. No wheezes/rales/rhonchi. Gastrointestinal: Soft. No distention, no guarding, no rebound. Nontender.    Genitourinary/rectal:Deferred Musculoskeletal: Nontender with normal range of motion in all extremities. No joint effusions.  No lower extremity tenderness.  No edema. Neurologic: No facial droop.  No focal neurologic deficits.  He is generally weak in all 4 extremities but he is able to move.  Normal speech and language.  Skin:  Skin is warm, dry and intact. No rash noted. Psychiatric: Anxious and depressed mood.  Tearful.   ____________________________________________  LABS (pertinent positives/negatives) I, Lisa Roca, MD the attending physician have reviewed the labs noted below.  Labs Reviewed  URINALYSIS, COMPLETE (UACMP) WITH MICROSCOPIC - Abnormal; Notable for the following components:      Result Value   Color, Urine STRAW (*)    APPearance CLEAR (*)    All other components within normal limits  COMPREHENSIVE METABOLIC PANEL - Abnormal; Notable for the following components:   Glucose, Bld 100 (*)    Total Bilirubin 1.4 (*)    All other components within normal limits  TROPONIN I  LACTIC ACID, PLASMA  CBC WITH DIFFERENTIAL/PLATELET    ____________________________________________    EKG I, Lisa Roca, MD, the attending physician have personally viewed and interpreted all ECGs.  60 bpm normal sinus rhythm.  Left axis deviation.  Left bundle branch block.  Old left bundle branch  block ____________________________________________  RADIOLOGY   None __________________________________________  PROCEDURES  Procedure(s) performed: None  Procedures  Critical Care performed: None   ____________________________________________  ED COURSE / ASSESSMENT AND PLAN  Pertinent labs & imaging results that were available during my care of the patient were reviewed by me and considered in my medical decision making (see chart for details).     Patient presents with his significant other, it sounds like he has had chronic problems with fatigue and generalized weakness over years as well as anxiety.  They state he is been dealing with "chronic Lyme "and doing a bunch of alternative medical treatments and supplements.  Patient states that at times he will "get like this "and just feel like he is completely weak all over.  No  slurred speech.  No focal neurologic deficit.  As we talked more, we discussed that he does not seem to have symptoms of acute infection or encephalopathy, nor focal intracranial finding and we chose to hold off on CT.  Sound like the patient is having PTSD-like anxiety and depression.  He is been adverse to psychiatric medications in the past, but is open to discussing this and I spoke with Dr. Weber Cooks will come evaluate patient.  Potentially he can start on medication to help with symptoms.  Patient care transferred to Dr. Clearnce Hasten at shift change.  Psychiatry consult pending.    CONSULTATIONS: Dr. Weber Cooks, psychiatry.  Patient / Family / Caregiver informed of clinical course, medical decision-making process, and agree with plan.    ___________________________________________   FINAL CLINICAL IMPRESSION(S) / ED DIAGNOSES   Final diagnoses:  Generalized weakness  Anxiety      ___________________________________________         Note: This dictation was prepared with Dragon dictation. Any transcriptional errors that result  from this process are unintentional    Lisa Roca, MD 02/19/18 1555

## 2018-02-19 NOTE — Consult Note (Signed)
  Psychiatry: Brief note.  Full note to follow.  Patient appears to suffer from panic attack like symptoms and excessive anxiety regarding somatic concerns.  After some discussion I suggest a trial of 15 mg of mirtazapine at night to assist with sleep and anxiety although I have strongly encouraged him to follow up with therapy and a primary care doctor as well.  No need for inpatient hospitalization.

## 2018-02-24 DIAGNOSIS — A6920 Lyme disease, unspecified: Secondary | ICD-10-CM | POA: Diagnosis not present

## 2018-02-24 DIAGNOSIS — I429 Cardiomyopathy, unspecified: Secondary | ICD-10-CM | POA: Diagnosis not present

## 2018-02-24 DIAGNOSIS — R0689 Other abnormalities of breathing: Secondary | ICD-10-CM | POA: Diagnosis not present

## 2018-03-03 DIAGNOSIS — I428 Other cardiomyopathies: Secondary | ICD-10-CM | POA: Diagnosis not present

## 2018-03-03 DIAGNOSIS — R569 Unspecified convulsions: Secondary | ICD-10-CM | POA: Diagnosis not present

## 2018-03-03 DIAGNOSIS — R0602 Shortness of breath: Secondary | ICD-10-CM | POA: Diagnosis not present

## 2018-03-06 DIAGNOSIS — R251 Tremor, unspecified: Secondary | ICD-10-CM | POA: Diagnosis not present

## 2018-03-06 DIAGNOSIS — R410 Disorientation, unspecified: Secondary | ICD-10-CM | POA: Diagnosis not present

## 2018-03-06 DIAGNOSIS — R413 Other amnesia: Secondary | ICD-10-CM | POA: Diagnosis not present

## 2018-03-06 DIAGNOSIS — E538 Deficiency of other specified B group vitamins: Secondary | ICD-10-CM | POA: Diagnosis not present

## 2018-03-06 DIAGNOSIS — R6889 Other general symptoms and signs: Secondary | ICD-10-CM | POA: Diagnosis not present

## 2018-03-14 ENCOUNTER — Other Ambulatory Visit: Payer: Self-pay | Admitting: Psychiatry

## 2018-03-16 DIAGNOSIS — R569 Unspecified convulsions: Secondary | ICD-10-CM | POA: Diagnosis not present

## 2018-03-27 DIAGNOSIS — Z79899 Other long term (current) drug therapy: Secondary | ICD-10-CM | POA: Diagnosis not present

## 2018-03-27 DIAGNOSIS — I10 Essential (primary) hypertension: Secondary | ICD-10-CM | POA: Diagnosis not present

## 2018-03-27 DIAGNOSIS — Z125 Encounter for screening for malignant neoplasm of prostate: Secondary | ICD-10-CM | POA: Diagnosis not present

## 2018-04-02 DIAGNOSIS — I10 Essential (primary) hypertension: Secondary | ICD-10-CM | POA: Diagnosis not present

## 2018-04-02 DIAGNOSIS — M25562 Pain in left knee: Secondary | ICD-10-CM | POA: Diagnosis not present

## 2018-04-09 ENCOUNTER — Other Ambulatory Visit: Payer: Self-pay | Admitting: Psychiatry

## 2018-04-10 ENCOUNTER — Ambulatory Visit: Payer: 59 | Admitting: Psychiatry

## 2018-05-22 DIAGNOSIS — M25562 Pain in left knee: Secondary | ICD-10-CM | POA: Diagnosis not present

## 2018-09-22 DIAGNOSIS — Z79899 Other long term (current) drug therapy: Secondary | ICD-10-CM | POA: Diagnosis not present

## 2018-09-22 DIAGNOSIS — Z125 Encounter for screening for malignant neoplasm of prostate: Secondary | ICD-10-CM | POA: Diagnosis not present

## 2018-09-22 DIAGNOSIS — I10 Essential (primary) hypertension: Secondary | ICD-10-CM | POA: Diagnosis not present

## 2019-04-11 IMAGING — CR DG CHEST 2V
1 series · 2 of 2 positions shown · non-contrast
Comparison: 01/09/2015

CLINICAL DATA: Chest pain

EXAM:
CHEST  2 VIEW

[Series 1: dg chest 2 view · 0.14mm/px · 2 of 2 slices shown]
[im 1/2]
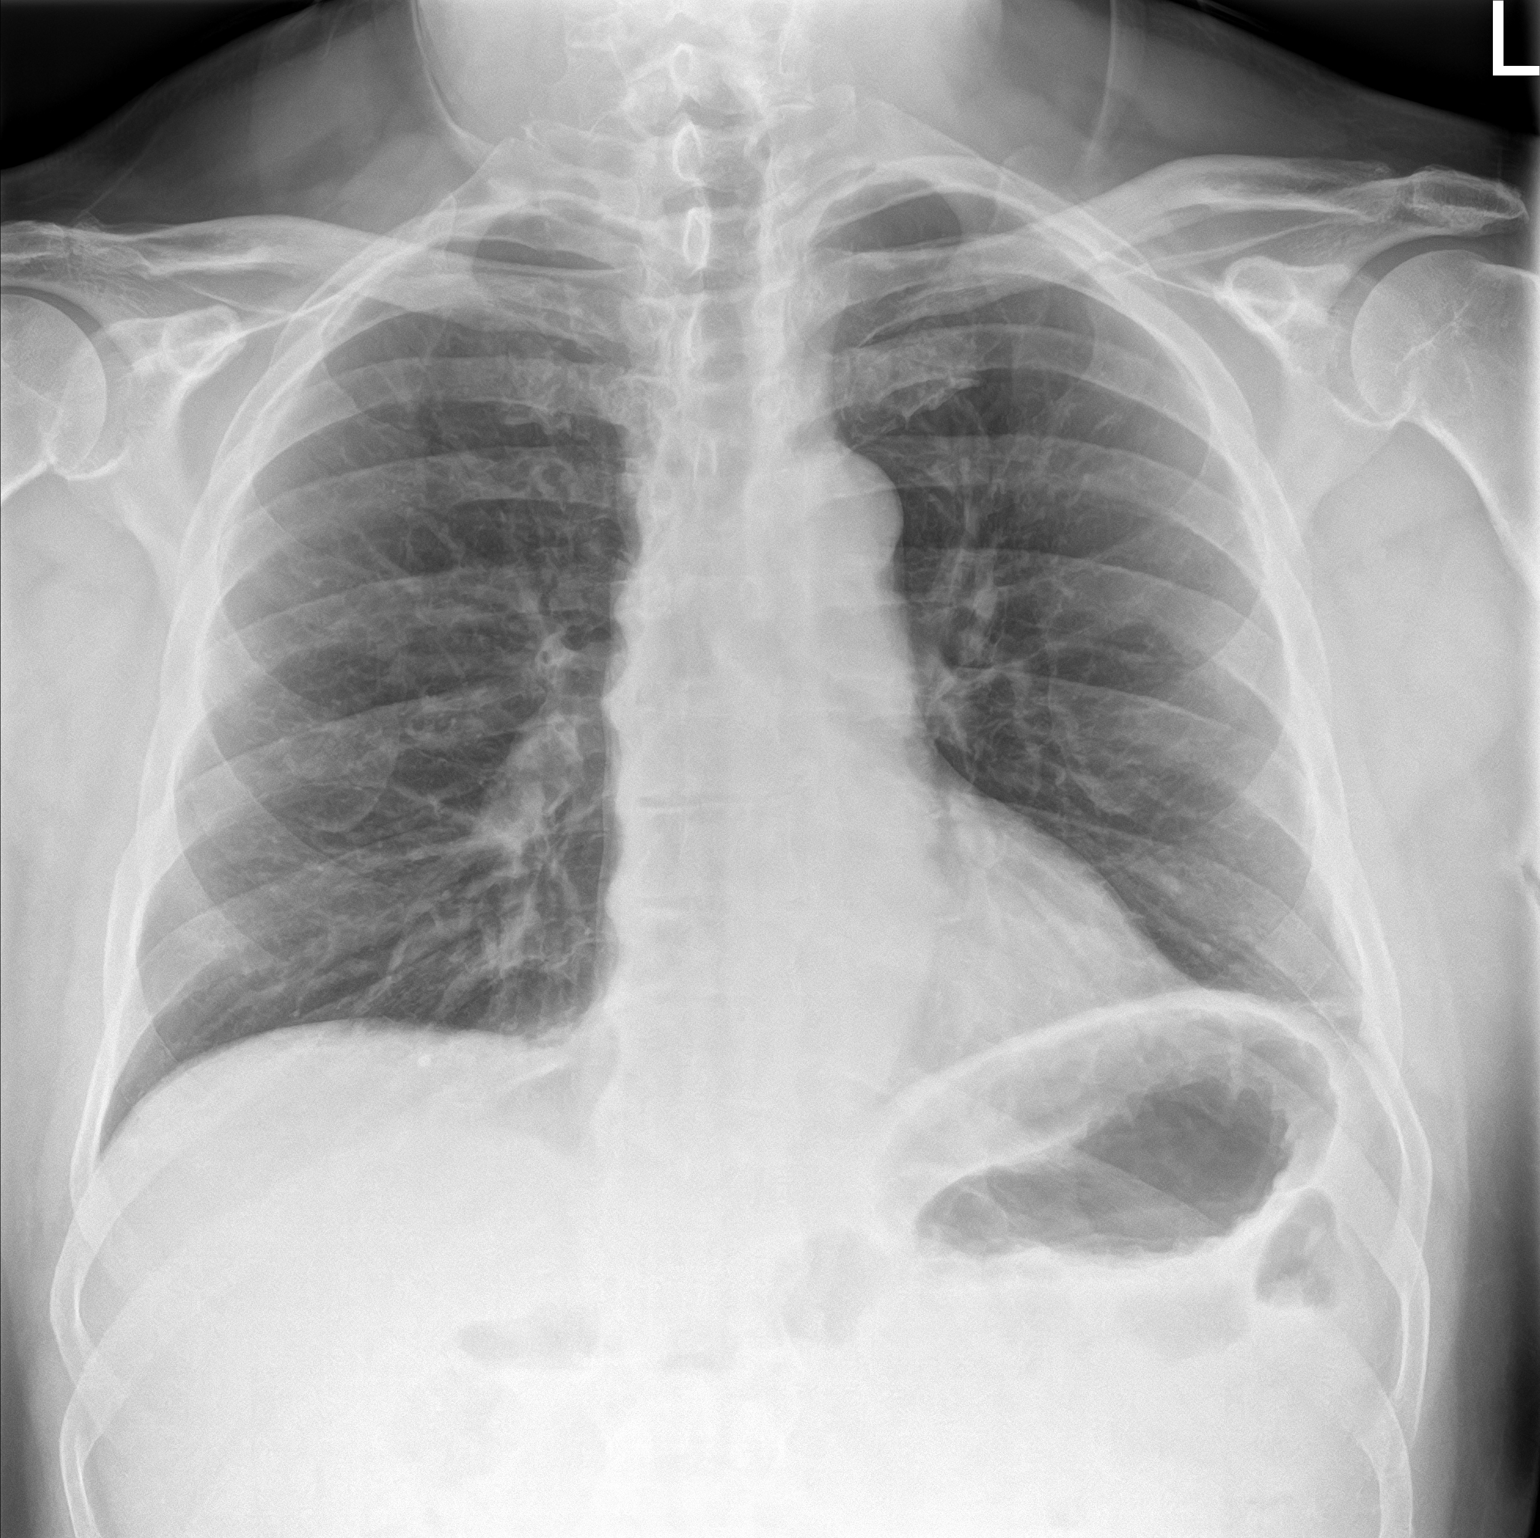
[im 2/2]
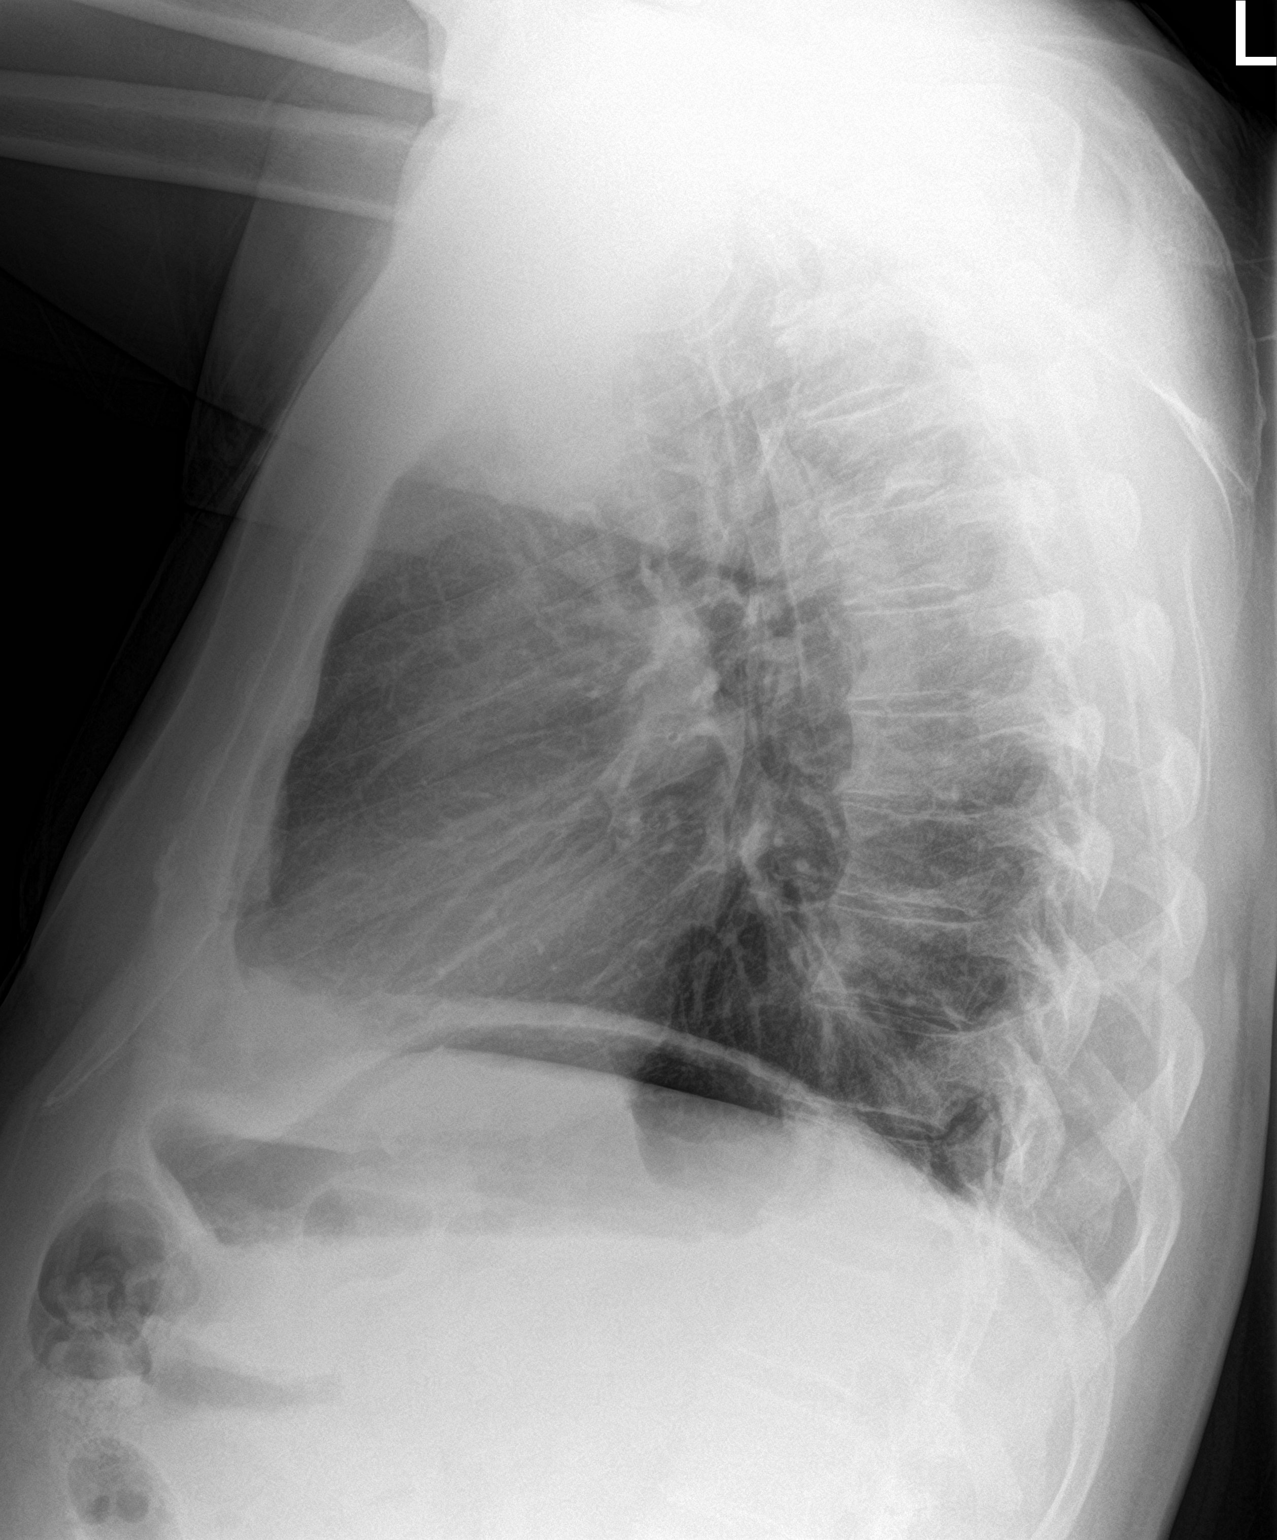

[2 of 2 positions shown; findings below may reference images not displayed]

FINDINGS: Minimal atelectasis at the left base. No consolidation or effusion.
Cardiomediastinal silhouette within normal limits. No pneumothorax.
Degenerative changes of the spine.
IMPRESSION: No active cardiopulmonary disease.

## 2019-04-18 DIAGNOSIS — J9601 Acute respiratory failure with hypoxia: Secondary | ICD-10-CM

## 2019-04-18 HISTORY — DX: Acute respiratory failure with hypoxia: J96.01

## 2019-05-07 ENCOUNTER — Observation Stay (HOSPITAL_COMMUNITY): Payer: 59

## 2019-05-07 ENCOUNTER — Encounter (HOSPITAL_COMMUNITY): Payer: Self-pay

## 2019-05-07 ENCOUNTER — Other Ambulatory Visit: Payer: Self-pay

## 2019-05-07 ENCOUNTER — Emergency Department (HOSPITAL_COMMUNITY): Payer: 59

## 2019-05-07 ENCOUNTER — Inpatient Hospital Stay (HOSPITAL_COMMUNITY)
Admission: EM | Admit: 2019-05-07 | Discharge: 2019-05-10 | DRG: 190 | Disposition: A | Discharge status: Home / Self Care | Payer: 59 | Attending: Internal Medicine | Admitting: Internal Medicine

## 2019-05-07 ENCOUNTER — Observation Stay (HOSPITAL_BASED_OUTPATIENT_CLINIC_OR_DEPARTMENT_OTHER): Payer: 59

## 2019-05-07 DIAGNOSIS — Z20828 Contact with and (suspected) exposure to other viral communicable diseases: Secondary | ICD-10-CM | POA: Diagnosis present

## 2019-05-07 DIAGNOSIS — J9601 Acute respiratory failure with hypoxia: Secondary | ICD-10-CM | POA: Diagnosis present

## 2019-05-07 DIAGNOSIS — D122 Benign neoplasm of ascending colon: Secondary | ICD-10-CM | POA: Diagnosis not present

## 2019-05-07 DIAGNOSIS — J440 Chronic obstructive pulmonary disease with acute lower respiratory infection: Secondary | ICD-10-CM | POA: Diagnosis present

## 2019-05-07 DIAGNOSIS — Z8619 Personal history of other infectious and parasitic diseases: Secondary | ICD-10-CM

## 2019-05-07 DIAGNOSIS — J209 Acute bronchitis, unspecified: Secondary | ICD-10-CM

## 2019-05-07 DIAGNOSIS — Z9049 Acquired absence of other specified parts of digestive tract: Secondary | ICD-10-CM

## 2019-05-07 DIAGNOSIS — I248 Other forms of acute ischemic heart disease: Secondary | ICD-10-CM | POA: Diagnosis present

## 2019-05-07 DIAGNOSIS — J9600 Acute respiratory failure, unspecified whether with hypoxia or hypercapnia: Secondary | ICD-10-CM | POA: Diagnosis present

## 2019-05-07 DIAGNOSIS — F459 Somatoform disorder, unspecified: Secondary | ICD-10-CM | POA: Diagnosis present

## 2019-05-07 DIAGNOSIS — E785 Hyperlipidemia, unspecified: Secondary | ICD-10-CM | POA: Diagnosis present

## 2019-05-07 DIAGNOSIS — F451 Undifferentiated somatoform disorder: Secondary | ICD-10-CM | POA: Diagnosis not present

## 2019-05-07 DIAGNOSIS — I110 Hypertensive heart disease with heart failure: Secondary | ICD-10-CM | POA: Diagnosis present

## 2019-05-07 DIAGNOSIS — Z6835 Body mass index (BMI) 35.0-35.9, adult: Secondary | ICD-10-CM

## 2019-05-07 DIAGNOSIS — I519 Heart disease, unspecified: Secondary | ICD-10-CM | POA: Diagnosis not present

## 2019-05-07 DIAGNOSIS — I5023 Acute on chronic systolic (congestive) heart failure: Secondary | ICD-10-CM | POA: Diagnosis present

## 2019-05-07 DIAGNOSIS — Z800 Family history of malignant neoplasm of digestive organs: Secondary | ICD-10-CM

## 2019-05-07 DIAGNOSIS — I447 Left bundle-branch block, unspecified: Secondary | ICD-10-CM | POA: Diagnosis not present

## 2019-05-07 DIAGNOSIS — E876 Hypokalemia: Secondary | ICD-10-CM | POA: Diagnosis present

## 2019-05-07 DIAGNOSIS — Z87891 Personal history of nicotine dependence: Secondary | ICD-10-CM

## 2019-05-07 DIAGNOSIS — K219 Gastro-esophageal reflux disease without esophagitis: Secondary | ICD-10-CM | POA: Diagnosis present

## 2019-05-07 DIAGNOSIS — R079 Chest pain, unspecified: Secondary | ICD-10-CM

## 2019-05-07 DIAGNOSIS — R0602 Shortness of breath: Secondary | ICD-10-CM | POA: Diagnosis not present

## 2019-05-07 DIAGNOSIS — E669 Obesity, unspecified: Secondary | ICD-10-CM | POA: Diagnosis present

## 2019-05-07 DIAGNOSIS — Z833 Family history of diabetes mellitus: Secondary | ICD-10-CM

## 2019-05-07 DIAGNOSIS — R778 Other specified abnormalities of plasma proteins: Secondary | ICD-10-CM

## 2019-05-07 DIAGNOSIS — I428 Other cardiomyopathies: Secondary | ICD-10-CM | POA: Diagnosis present

## 2019-05-07 DIAGNOSIS — J441 Chronic obstructive pulmonary disease with (acute) exacerbation: Secondary | ICD-10-CM | POA: Diagnosis not present

## 2019-05-07 DIAGNOSIS — F410 Panic disorder [episodic paroxysmal anxiety] without agoraphobia: Secondary | ICD-10-CM | POA: Diagnosis present

## 2019-05-07 DIAGNOSIS — Z79899 Other long term (current) drug therapy: Secondary | ICD-10-CM

## 2019-05-07 DIAGNOSIS — Z66 Do not resuscitate: Secondary | ICD-10-CM | POA: Diagnosis present

## 2019-05-07 LAB — CBC
HCT: 46.3 % (ref 39.0–52.0)
Hemoglobin: 15.2 g/dL (ref 13.0–17.0)
MCH: 30.5 pg (ref 26.0–34.0)
MCHC: 32.8 g/dL (ref 30.0–36.0)
MCV: 92.8 fL (ref 80.0–100.0)
Platelets: 264 10*3/uL (ref 150–400)
RBC: 4.99 MIL/uL (ref 4.22–5.81)
RDW: 13.6 % (ref 11.5–15.5)
WBC: 9.5 10*3/uL (ref 4.0–10.5)
nRBC: 0 % (ref 0.0–0.2)

## 2019-05-07 LAB — LACTIC ACID, PLASMA
Lactic Acid, Venous: 2 mmol/L (ref 0.5–1.9)
Lactic Acid, Venous: 2 mmol/L (ref 0.5–1.9)
Lactic Acid, Venous: 3.4 mmol/L (ref 0.5–1.9)
Lactic Acid, Venous: 4.9 mmol/L (ref 0.5–1.9)

## 2019-05-07 LAB — COMPREHENSIVE METABOLIC PANEL
ALT: 54 U/L — ABNORMAL HIGH (ref 0–44)
AST: 60 U/L — ABNORMAL HIGH (ref 15–41)
Albumin: 3.9 g/dL (ref 3.5–5.0)
Alkaline Phosphatase: 60 U/L (ref 38–126)
Anion gap: 12 (ref 5–15)
BUN: 6 mg/dL — ABNORMAL LOW (ref 8–23)
CO2: 22 mmol/L (ref 22–32)
Calcium: 8.8 mg/dL — ABNORMAL LOW (ref 8.9–10.3)
Chloride: 103 mmol/L (ref 98–111)
Creatinine, Ser: 1.05 mg/dL (ref 0.61–1.24)
GFR calc Af Amer: >60 mL/min (ref >60)
GFR calc non Af Amer: >60 mL/min (ref >60)
Glucose, Bld: 139 mg/dL — ABNORMAL HIGH (ref 70–99)
Potassium: 3.4 mmol/L — ABNORMAL LOW (ref 3.5–5.1)
Sodium: 137 mmol/L (ref 135–145)
Total Bilirubin: 0.5 mg/dL (ref 0.3–1.2)
Total Protein: 6.9 g/dL (ref 6.5–8.1)

## 2019-05-07 LAB — RESPIRATORY PANEL BY PCR

## 2019-05-07 LAB — PROTIME-INR
INR: 1.1 (ref 0.8–1.2)
Prothrombin Time: 13.7 seconds (ref 11.4–15.2)

## 2019-05-07 LAB — POCT I-STAT EG7
Bicarbonate: 25.8 mmol/L (ref 20.0–28.0)
Calcium, Ion: 1.12 mmol/L — ABNORMAL LOW (ref 1.15–1.40)
HCT: 46 % (ref 39.0–52.0)
Hemoglobin: 15.6 g/dL (ref 13.0–17.0)
O2 Saturation: 99 %
Potassium: 3.6 mmol/L (ref 3.5–5.1)
Sodium: 139 mmol/L (ref 135–145)
TCO2: 27 mmol/L (ref 22–32)
pCO2, Ven: 43.2 mmHg — ABNORMAL LOW (ref 44.0–60.0)
pH, Ven: 7.384 (ref 7.250–7.430)
pO2, Ven: 169 mmHg — ABNORMAL HIGH (ref 32.0–45.0)

## 2019-05-07 LAB — ECHOCARDIOGRAM COMPLETE
Height: 72 in
Weight: 4160 oz

## 2019-05-07 LAB — HIV ANTIBODY (ROUTINE TESTING W REFLEX): HIV Screen 4th Generation wRfx: NONREACTIVE

## 2019-05-07 LAB — TROPONIN I (HIGH SENSITIVITY)
Troponin I (High Sensitivity): 108 ng/L (ref <18)
Troponin I (High Sensitivity): 372 ng/L (ref <18)
Troponin I (High Sensitivity): 426 ng/L (ref <18)

## 2019-05-07 LAB — POC SARS CORONAVIRUS 2 AG -  ED: SARS Coronavirus 2 Ag: NEGATIVE

## 2019-05-07 LAB — BRAIN NATRIURETIC PEPTIDE: B Natriuretic Peptide: 31.9 pg/mL (ref 0.0–100.0)

## 2019-05-07 LAB — SARS CORONAVIRUS 2 (TAT 6-24 HRS): SARS Coronavirus 2: NEGATIVE

## 2019-05-07 LAB — TSH: TSH: 1.805 u[IU]/mL (ref 0.350–4.500)

## 2019-05-07 LAB — PROCALCITONIN: Procalcitonin: <0.1 ng/mL

## 2019-05-07 LAB — APTT: aPTT: 35 seconds (ref 24–36)

## 2019-05-07 MED ORDER — ALBUTEROL SULFATE HFA 108 (90 BASE) MCG/ACT IN AERS
2.0000 | INHALATION_SPRAY | RESPIRATORY_TRACT | Status: DC | PRN
  Filled 2019-05-07: qty 6.7

## 2019-05-07 MED ORDER — ALBUTEROL SULFATE (2.5 MG/3ML) 0.083% IN NEBU: 2.5000 mg | INHALATION_SOLUTION | RESPIRATORY_TRACT | Status: DC | PRN

## 2019-05-07 MED ORDER — MORPHINE SULFATE (PF) 2 MG/ML IV SOLN
2.0000 mg | INTRAVENOUS | Status: DC | PRN
  Administered 2019-05-07: 2 mg via INTRAVENOUS
  Filled 2019-05-07 (×2): qty 1

## 2019-05-07 MED ORDER — HYDROCODONE-ACETAMINOPHEN 5-325 MG PO TABS
1.0000 | ORAL_TABLET | Freq: Two times a day (BID) | ORAL | Status: DC
  Administered 2019-05-07 – 2019-05-10 (×6): 1 via ORAL
  Filled 2019-05-07 (×6): qty 1

## 2019-05-07 MED ORDER — SODIUM CHLORIDE 0.9% FLUSH
3.0000 mL | Freq: Two times a day (BID) | INTRAVENOUS | Status: DC
  Administered 2019-05-07 – 2019-05-10 (×6): 3 mL via INTRAVENOUS

## 2019-05-07 MED ORDER — IOHEXOL 300 MG/ML  SOLN
75.0000 mL | Freq: Once | INTRAMUSCULAR | Status: AC | PRN
  Administered 2019-05-07: 75 mL via INTRAVENOUS

## 2019-05-07 MED ORDER — IPRATROPIUM-ALBUTEROL 20-100 MCG/ACT IN AERS
1.0000 | INHALATION_SPRAY | Freq: Four times a day (QID) | RESPIRATORY_TRACT | Status: DC
  Filled 2019-05-07: qty 4

## 2019-05-07 MED ORDER — METHYLPREDNISOLONE SODIUM SUCC 125 MG IJ SOLR
60.0000 mg | Freq: Two times a day (BID) | INTRAMUSCULAR | Status: AC
  Administered 2019-05-07 – 2019-05-08 (×2): 60 mg via INTRAVENOUS
  Filled 2019-05-07 (×2): qty 2

## 2019-05-07 MED ORDER — ALBUTEROL SULFATE HFA 108 (90 BASE) MCG/ACT IN AERS
2.0000 | INHALATION_SPRAY | RESPIRATORY_TRACT | Status: AC
  Administered 2019-05-07 (×3): 2 via RESPIRATORY_TRACT
  Filled 2019-05-07: qty 6.7

## 2019-05-07 MED ORDER — PREDNISONE 20 MG PO TABS
40.0000 mg | ORAL_TABLET | Freq: Every day | ORAL | Status: DC
  Administered 2019-05-08 – 2019-05-10 (×3): 40 mg via ORAL
  Filled 2019-05-07 (×3): qty 2

## 2019-05-07 MED ORDER — IOHEXOL 300 MG/ML  SOLN
100.0000 mL | Freq: Once | INTRAMUSCULAR | Status: AC | PRN
  Administered 2019-05-07: 100 mL via INTRAVENOUS

## 2019-05-07 MED ORDER — LACTATED RINGERS IV SOLN
INTRAVENOUS | Status: DC
  Administered 2019-05-07 – 2019-05-08 (×2): via INTRAVENOUS

## 2019-05-07 MED ORDER — SODIUM CHLORIDE 0.9 % IV BOLUS
1000.0000 mL | Freq: Once | INTRAVENOUS | Status: AC
  Administered 2019-05-07: 1000 mL via INTRAVENOUS

## 2019-05-07 MED ORDER — CLONAZEPAM 0.5 MG PO TABS
1.0000 mg | ORAL_TABLET | Freq: Every day | ORAL | Status: DC | PRN
  Administered 2019-05-08 – 2019-05-09 (×2): 1 mg via ORAL
  Filled 2019-05-07 (×3): qty 2

## 2019-05-07 MED ORDER — HEPARIN (PORCINE) 25000 UT/250ML-% IV SOLN
1350.0000 [IU]/h | INTRAVENOUS | Status: DC
  Administered 2019-05-07: 1250 [IU]/h via INTRAVENOUS
  Administered 2019-05-09: 1350 [IU]/h via INTRAVENOUS
  Filled 2019-05-07 (×2): qty 250

## 2019-05-07 MED ORDER — ENOXAPARIN SODIUM 40 MG/0.4ML ~~LOC~~ SOLN
40.0000 mg | SUBCUTANEOUS | Status: DC
  Administered 2019-05-07: 40 mg via SUBCUTANEOUS
  Filled 2019-05-07: qty 0.4

## 2019-05-07 MED ORDER — IPRATROPIUM-ALBUTEROL 0.5-2.5 (3) MG/3ML IN SOLN
3.0000 mL | Freq: Four times a day (QID) | RESPIRATORY_TRACT | Status: DC
  Administered 2019-05-07 – 2019-05-08 (×5): 3 mL via RESPIRATORY_TRACT
  Filled 2019-05-07 (×5): qty 3

## 2019-05-07 MED ORDER — MIRTAZAPINE 15 MG PO TABS: 15.0000 mg | ORAL_TABLET | Freq: Every day | ORAL | Status: DC

## 2019-05-07 NOTE — H&P (Signed)
History and Physical    Michael Love [id number] DOB: 11/18/1955 DOA: 05/07/2019  PCP: Lawerance Cruel, MD Consultants:  Manuella Ghazi - neurology Patient coming from:  Home - lives with wife; NOK: Wife, Michael Love, [phone]  Chief Complaint:  SOB  HPI: Michael Love is a 63 y.o. male with medical history significant of panic d/o; thyroid storm; and COPD presenting with SOB.  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  He also reports a h/o a "large" colon mass - chart reviewed and indicates that he underwent right extended hemicolectomy due to an adenomatous polyp that was unable to be resected by colonoscopy in 01/2018.  He reports SOB, cough, and URI symptoms that started 9-10 weeks ago.  He reports that others in his family have had similar issues requiring recurrent doctor visits; they have been COVID-19 negative on several occasions.  He has had recurrent problems particularly with sleep and also has DOE.  He awoke overnight with severe respiratory distress and called 911.  EMS described a patient in extremis; he was started on BIPAP and given Mag, albuterol, and solumedrol and was improved upon ER admission. At the time of my evaluation, he was fully conversant without apparent dyspnea and I was able to decrease his NCO2 from 6 to 2L without apparent difficulty.     ED Course:  Probably COPD exacerbation but needs COVID r/o.  Coughing x 2 months, SOB x a few days.  Looked severely ill with EMS, improved with BIPAP, Solumedrol, Albuterol.  On 4L [place].  Troponin mildly elevated, EKG ok, likely demand.  No antibiotics.  Review of Systems: As per HPI; otherwise review of systems reviewed and negative.   Ambulatory Status:  Ambulates without assistance  Past Medical  History:  Diagnosis Date   Asthma    as a child   Difficult intubation 2012   difficulty with intubation; had to use glide scope   Hyperthyroidism 20 years   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 did not need surgery or treatment, pt has not had any medication for this condition since then   Incarcerated inguinal hernia 05/19/2011   Lyme disease    Panic disorder 02/19/2018    Past Surgical History:  Procedure Laterality Date   CYST REMOVAL HAND Left    index finger left hand   HERNIA REPAIR  1982   repaired hernia in upper abd   INGUINAL HERNIA REPAIR  05/21/2011   Procedure: HERNIA REPAIR INGUINAL ADULT;  Surgeon: Stark Klein, MD;  Location: [facility];  Service: General;  Laterality: Right;   LAPAROSCOPIC RIGHT HEMI COLECTOMY Right 08/18/2017   Procedure: LAPAROSCOPIC RIGHT HEMI COLECTOMY;  Surgeon: Herbert Pun, MD;  Location: [facility];  Service: General;  Laterality: Right;   ROTATOR CUFF REPAIR Right 2009   Dr Latanya Maudlin   TONSILLECTOMY      Social History   Socioeconomic History   Marital status: Married    Spouse name: Not on file   Number of children: Not on file   Years of education: Not on file   Highest education level: Not on file  Occupational History   Not on file  Social Needs   Financial resource strain: Not on file   Food insecurity    Worry: Not on file  Inability: Not on file   Transportation needs    Medical: Not on file    Non-medical: Not on file  Tobacco Use   Smoking status: Former Smoker    Types: Cigarettes   Smokeless tobacco: Never Used  Substance and Sexual Activity   Alcohol use: No    Comment: 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; recovered alcoholic   Drug use: No   Sexual activity: Yes  Lifestyle   Physical activity    Days per week: Not on file    Minutes per session: Not on file   Stress: Not on file  Relationships   Social connections    Talks on phone: Not on file    Gets together: Not on file     Attends religious service: Not on file    Active member of club or organization: Not on file    Attends meetings of clubs or organizations: Not on file    Relationship status: Not on file   Intimate partner violence    Fear of current or ex partner: Not on file    Emotionally abused: Not on file    Physically abused: Not on file    Forced sexual activity: Not on file  Other Topics Concern   Not on file  Social History Narrative   Not on file    No Known Allergies  Family History  Problem Relation Age of Onset   Diabetes Mother    Cancer Father    Colon cancer Father    Cancer Brother     Prior to Admission medications   Medication Sig Start Date End Date Taking? Authorizing Provider  clonazePAM (KLONOPIN) 1 MG tablet Take 1 mg by mouth daily as needed for anxiety. 05/06/17   [provider]  gabapentin (NEURONTIN) 300 MG capsule Take 1 capsule (300 mg total) by mouth 2 (two) times daily for 15 days. 08/22/17 09/06/17  Herbert Pun, MD  HYDROcodone-acetaminophen (NORCO) 5-325 MG per tablet 1-2 tablets q 4-6 hrs prn pain. Patient taking differently: Take 1 tablet by mouth 2 (two) times daily.  06/07/11   Stark Klein, MD  mirtazapine (REMERON) 15 MG tablet TAKE 1 TABLET BY MOUTH EVERYDAY AT BEDTIME 03/17/18   Clapacs, Madie Reno, MD    Physical Exam: Vitals:   05/07/19 1615 05/07/19 1645 05/07/19 1700 05/07/19 1800  BP: 115/80 128/61 113/66 123/89  Pulse: 77 73 72 79  Resp: 18 (!) 21 (!) 23 20  Temp:    98 F (36.7 C)  TempSrc:    Oral  SpO2: 97% 97% 97% 99%  Weight:      Height:          General:  Appears calm and comfortable and is NAD  Eyes:  PERRL, EOMI, normal lids, iris  ENT:  grossly normal hearing, lips & tongue, mmm; appropriate dentition  Neck:  no LAD, masses or thyromegaly  Cardiovascular:  RRR, no m/r/g. No LE edema.   Respiratory:   CTA bilaterally with no rales/rhonchi, mild expiratory wheezing, good air movement.  Normal  respiratory effort.  Abdomen:  soft, NT, ND, NABS  Back:   normal alignment, no CVAT  Skin:  no rash or induration seen on limited exam  Musculoskeletal:  grossly normal tone BUE/BLE, good ROM, no bony abnormality  Psychiatric:  grossly normal mood and affect, speech fluent and appropriate, AOx3  Neurologic:  CN 2-12 grossly intact, moves all extremities in coordinated fashion, sensation intact    Radiological Exams on Admission: Ct  Chest W Contrast  Result Date: 05/07/2019 CLINICAL DATA:  Shortness of breath, fever, acute respiratory illness, persistent cough EXAM: CT CHEST WITH CONTRAST TECHNIQUE: Multidetector CT imaging of the chest was performed during intravenous contrast administration. Sagittal and coronal MPR images reconstructed from axial data set. CONTRAST:  16mL OMNIPAQUE IOHEXOL 300 MG/ML  SOLN IV. COMPARISON:  None FINDINGS: Cardiovascular: Aorta normal caliber without aneurysm or dissection on non dedicated exam. Mild atherosclerotic calcifications at the coronary arteries. Heart unremarkable. No pericardial effusion. Mediastinum/Nodes: Base of cervical region normal appearance. Esophagus normal appearance. Scattered normal sized mediastinal lymph nodes. No thoracic adenopathy. Lungs/Pleura: Minimal subsegmental atelectasis in lower lobes. Lungs otherwise clear. No pulmonary infiltrate, pleural effusion, pneumothorax or mass. Upper Abdomen: Probable fatty infiltration of liver. Remaining visualized upper abdomen unremarkable. Musculoskeletal: No acute osseous findings. IMPRESSION: No acute intrathoracic abnormalities. Minimal coronary arterial calcification and subsegmental atelectasis in lower lobes. Electronically Signed   By: Lavonia Dana M.D.   On: 05/07/2019 14:46   Ct Abdomen Pelvis W Contrast  Result Date: 05/07/2019 CLINICAL DATA:  Abdominal distention. EXAM: CT ABDOMEN AND PELVIS WITH CONTRAST TECHNIQUE: Multidetector CT imaging of the abdomen and pelvis was performed  using the standard protocol following bolus administration of intravenous contrast. CONTRAST:  148mL OMNIPAQUE IOHEXOL 300 MG/ML  SOLN COMPARISON:  CT scan dated 07/15/2017 FINDINGS: Lower chest: Normal. Hepatobiliary: No focal liver abnormality is seen. No gallstones, gallbladder wall thickening, or biliary dilatation. Pancreas: Unremarkable. No pancreatic ductal dilatation or surrounding inflammatory changes. Spleen: Normal in size without focal abnormality. Adrenals/Urinary Tract: 10 mm simple cyst on the upper pole of the right kidney. Kidneys, ureters, bladder, and adrenal glands are otherwise normal. Stomach/Bowel: The patient appears to have had resection of cecum. The stomach and small bowel are normal. Numerous diverticula in the sigmoid portion of the colon. No diverticulitis. Vascular/Lymphatic: Aortic atherosclerosis. No adenopathy. Reproductive: Prostate is unremarkable. Other: No abdominal wall hernia or abnormality. No abdominopelvic ascites. Musculoskeletal: No acute or significant osseous findings. IMPRESSION: 1. Benign-appearing abdomen and pelvis. 2. Diverticulosis of the sigmoid portion of the colon. 3. Aortic Atherosclerosis (ICD10-I70.0). Electronically Signed   By: Lorriane Shire M.D.   On: 05/07/2019 13:08   Xr Chest Single View  Result Date: 05/07/2019 CLINICAL DATA:  Shortness of breath EXAM: PORTABLE CHEST 1 VIEW COMPARISON:  August 12, 2017 FINDINGS: There is no edema or consolidation. Heart is upper normal in size with pulmonary vascularity normal. No adenopathy. There is degenerative change in the thoracic spine. IMPRESSION: No edema or consolidation.  Heart upper normal in size. Electronically Signed   By: Lowella Grip III M.D.   On: 05/07/2019 08:38    EKG: Independently reviewed.  NSR with rate 82; LBBB with NSCSLT   Labs on Admission: I have personally reviewed the available labs and imaging studies at the time of the admission.  Pertinent labs:   ABG:  7.384/43.2/169.0 K+ 3.4 Glucose 139 AST 60/ALT 54 BNP 31.9 HS troponin 108, 372, 426 Lactate 2.0, 2.0, 3.4 Procalcitonin <0.10 Normal CBC POC COVID negative; Hologic COVID pending   Assessment/Plan Principal Problem:   Acute respiratory failure (HCC) Active Problems:   Adenomatous polyp of ascending colon   Somatic symptom disorder   Elevated troponin   Acute respiratory failure -Patient with 10 weeks of respiratory symptoms presenting with acute respiratory failure -When evaluated by EMS, he was reportedly clear in extremis and requiring BIPAP -He appeared improved while in the ER and has had ongoing improvement -CXR and CT C/A/P were  unremarkable -Procalcitonin was negative -However, lactate has been uptrending -COVID negative -Viral respiratory panel negative -Unclear diagnosis, however patient is showing clinical stability and improvement -Will observe overnight on telemetry -With recent further lactic acid increase, he was given a 1L bolus and lactate was repeated -He was given steroids but antibiotics will not be continued given clinical presentation and negative procalcitonin and imaging -Will give prn Duonebs and Albuterol  Elevated troponin -Mildly elevated on presentation -Ongoing increase with elevated delta -Cardiology consulted -Low suspicion for acute MI, suspect that this is related to demand ischemia based on clinical presentation -Echo ordered; EF in 2019 with EF 35-40%, but current presentation is less suspicious for acute CHF exacerbation -Echo reviewed and does show appear EF 35-40% with regional wall motion abnormalities; as a result, this is now more concerning for acute ischemia as the cause of his symptoms and patient will be started on heparin drip for what now appears to be NSTEMI -Will make NPO after midnight in case of need for cath tomorrow -elevated lactate currently appears more likely related to cardiac issue than pulmonary  H/o adenomatous  polyp -Reported h/o polyp and review of chart shows extensive surgery required for removal -With patient's uncertain presenting complaints including intermittent rectal bleeding - needs stool guaiac, particularly in light of starting heparin (nurse was notified) -CT was ordered and negative for acute disease, which is reassuring  H/o somatic symptom disorder -Reported h/o possible ALS, Lyme -Low current concern for either of these issues -Will follow    Note: This patient has been tested and is negative for the novel coronavirus COVID-19.  DVT prophylaxis: Heparin Code Status:  DNR - confirmed with patient Family Communication: None present Disposition Plan:  Home once clinically improved Consults called: Cardiology  Admission status: It is my clinical opinion that referral for OBSERVATION is reasonable and necessary in this patient based on the above information provided. The aforementioned taken together are felt to place the patient at high risk for further clinical deterioration. However it is anticipated that the patient may be medically stable for discharge from the hospital within 24 to 48 hours.    Karmen Bongo MD [facility]   How to contact the [facility] Attending or Consulting provider [place] or covering provider during after hours [place], for this patient?  1. Check the care team in [facility] and look for a) attending/consulting [facility] provider listed and b) the [facility] team listed 2. Log into [email or web address] and use [place]'s universal password to access. If you do not have the password, please contact the hospital operator. 3. Locate the [facility] provider you are looking for under [facility] and page to a number that you can be directly reached. 4. If you still have difficulty reaching the provider, please page the [facility] (Director on Call) for the Hospitalists listed on amion for assistance.   05/07/2019, 6:39 PM

## 2019-05-07 NOTE — Consult Note (Addendum)
Cardiology Consultation:   Patient ID: Michael Love; [id number]; [birth year]   Admit date: 05/07/2019 Date of Consult: 05/07/2019  Primary Care Provider: Lawerance Cruel, MD Primary Cardiologist: Jobe Gibbon, MD ([facility]) Primary Electrophysiologist:  None  Patient Profile:   Michael Love is a 63 y.o. male with a hx of COPD, CHF( NICM, EF 40%), HLD, and lyme disease who is being seen today for the evaluation of elevated troponin at the request of Dr. Sedonia Small.  History of Present Illness:   Michael Love is presenting with shortness of breath for 1 week that was worsening, noted to be in significant respiratory distress on EMS arrival O2 saturations in the 70s. Patient was placed on BiPAP and given albuterol, solu-medrol, and magnesium.   He reports that for the past few months he has been having worsening shortness of breath that is worse at night. He also complains of a productive cough with occasional green sputum, occasional chest pain that occurs with the cough, located substernal, no radiation, feels like a dull pain. He also endorses some loose stools for the past month. He reports orthopnea, sleeping at around a 25-30 degree incline, and PND. Denies any LE edema. Denies any fevers, chills, nausea, vomiting, headaches, change in appetite, or other symptom. He reports that his daughter, daughters child, sisters child and sister have all had similar symptoms, and that his wife has started coughing as well. He reports that 3 of them have been tested for COVID and they were all negative. Denies any recent travel. He denies any smoking, EtOH, or recreational drug use.   Of note he has seen cardiologist Dr. Clayborn Bigness last year for pre-op evaluation. He had an echocardiogram that showed and EF of 40%, mild LVH, MR and TR. NM perfusion study showed LVH, mod-severe globally depressed LV function, 35-40%, no clear reversible ischemia, could be NICM. He was started on  metoprolol and losartan at that time however has not been taking anything recently. .  On arrival he was afebrile, RR 12-22, satting well on 6L, HR 70s, BP normal. Labs signfiicant for LA 2.0. Troponin trending upwards 108 > 372 > 426. Rapid covid negative. BNP 31. CBC unremarkable. Na 137, K 3.4, AST 60, ALT 54. CT abd pelvis showed no acute findings, diverticulosis of hte sigmoid portion of the colon, AA. CXR showed no acute findings. EKG showed NSR, LBBB (seen on prior).   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 did not need surgery or treatment, pt has not had any medication for this condition since then   Incarcerated inguinal hernia 05/19/2011   Lyme disease    Panic disorder 02/19/2018    Past Surgical History:  Procedure Laterality Date   CYST REMOVAL HAND Left    index finger left hand   HERNIA REPAIR  1982   repaired hernia in upper abd   INGUINAL HERNIA REPAIR  05/21/2011   Procedure: HERNIA REPAIR INGUINAL ADULT;  Surgeon: Stark Klein, MD;  Location: [facility];  Service: General;  Laterality: Right;   LAPAROSCOPIC RIGHT HEMI COLECTOMY Right 08/18/2017   Procedure: LAPAROSCOPIC RIGHT HEMI COLECTOMY;  Surgeon: Herbert Pun, MD;  Location: [facility] ORS;  Service: General;  Laterality: Right;   ROTATOR CUFF REPAIR Right 2009   Dr Latanya Maudlin  TONSILLECTOMY       Home Medications:  Prior to Admission medications   Medication Sig Start Date End Date Taking? Authorizing Provider  clonazePAM (KLONOPIN) 1 MG tablet Take 1 mg by mouth daily as needed for anxiety. 05/06/17   [provider]  gabapentin (NEURONTIN) 300 MG capsule Take 1 capsule (300 mg total) by mouth 2 (two) times daily for 15 days. 08/22/17 09/06/17  Herbert Pun, MD  HYDROcodone-acetaminophen (NORCO) 5-325 MG per tablet 1-2  tablets q 4-6 hrs prn pain. Patient taking differently: Take 1 tablet by mouth 2 (two) times daily.  06/07/11   Stark Klein, MD  mirtazapine (REMERON) 15 MG tablet TAKE 1 TABLET BY MOUTH EVERYDAY AT BEDTIME 03/17/18   Clapacs, Madie Reno, MD    Inpatient Medications: Scheduled Meds:  Continuous Infusions:  PRN Meds:   Allergies:   No Known Allergies  Social History:   Social History   Socioeconomic History   Marital status: Married    Spouse name: Not on file   Number of children: Not on file   Years of education: Not on file   Highest education level: Not on file  Occupational History   Not on file  Social Needs   Financial resource strain: Not on file   Food insecurity    Worry: Not on file    Inability: Not on file   Transportation needs    Medical: Not on file    Non-medical: Not on file  Tobacco Use   Smoking status: Former Smoker    Types: Cigarettes   Smokeless tobacco: Never Used  Substance and Sexual Activity   Alcohol use: No    Comment: 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; recovered alcoholic   Drug use: No   Sexual activity: Yes  Lifestyle   Physical activity    Days per week: Not on file    Minutes per session: Not on file   Stress: Not on file  Relationships   Social connections    Talks on phone: Not on file    Gets together: Not on file    Attends religious service: Not on file    Active member of club or organization: Not on file    Attends meetings of clubs or organizations: Not on file    Relationship status: Not on file   Intimate partner violence    Fear of current or ex partner: Not on file    Emotionally abused: Not on file    Physically abused: Not on file    Forced sexual activity: Not on file  Other Topics Concern   Not on file  Social History Narrative   Not on file    Family History:    Family History  Problem Relation Age of Onset   Diabetes Mother    Cancer Father    Colon cancer Father    Cancer Brother        ROS:  Please see the history of present illness.  ROS  All other ROS reviewed and negative.     Physical Exam/Data:   Vitals:   05/07/19 1200 05/07/19 1215 05/07/19 1230 05/07/19 1305  BP: 114/88 105/74 106/82 121/77  Pulse: 76 70 66 79  Resp: 13 20 (!) 21 (!) 22  Temp:      TempSrc:      SpO2: 96% 97% 96% 98%  Weight:      Height:       No intake or output data in the 24 hours ending  05/07/19 1331 Filed Weights   05/07/19 0809  Weight: 117.9 kg   Body mass index is 35.26 kg/m.  General:  Elderly male, NAD, laying in bed HEENT: normal Lymph: no adenopathy Neck: no JVD Endocrine:  No thryomegaly Vascular: No carotid bruits; FA pulses 2+ bilaterally without bruits  Cardiac:  normal S1, S2; RRR; no murmur  Lungs: Diffuse wheezing throughout lungs, no wheezing, rhonchi or rales  Abd: soft, distended, no hepatomegaly  Ext: no LE edema Musculoskeletal:  No deformities, BUE and BLE strength normal and equal Skin: warm and dry  Neuro:  CNs 2-12 intact, no focal abnormalities noted Psych:  Normal affect   EKG:  The EKG was personally reviewed and demonstrates: NSR, LBBB Telemetry:  Telemetry was personally reviewed and demonstrates:  NSR  Relevant CV Studies:  Echocardiogram 08/19/2018: INTERPRETATION MILD LV SYSTOLIC DYSFUNCTION (See above)  WITH MILD LVH NORMAL RIGHT VENTRICULAR SYSTOLIC FUNCTION MILD VALVULAR REGURGITATION (See above) NO VALVULAR STENOSIS MILD MR, TR EF 40%  NM Myocardiacl perfusion SPECT 08/21/2017:  Abnormal myocardial perfusion scan evidence of left  ventricular enlargement moderate to severe globally depressed left  ventricular function ejection fraction between 35 - 40% ,no clear evidence  of reversible ischemia. This represents probable a nonischemic  cardiomyopathy further workup and assessment may be necessary based on  symptoms. Conclusion intermediate scan  Laboratory Data:  Chemistry Recent Labs  Lab 05/07/19 0819  05/07/19 0830  NA 137 139  K 3.4* 3.6  CL 103  --   CO2 22  --   GLUCOSE 139*  --   BUN 6*  --   CREATININE 1.05  --   CALCIUM 8.8*  --   GFRNONAA >60  --   GFRAA >60  --   ANIONGAP 12  --     Recent Labs  Lab 05/07/19 0819  PROT 6.9  ALBUMIN 3.9  AST 60*  ALT 54*  ALKPHOS 60  BILITOT 0.5   Hematology Recent Labs  Lab 05/07/19 0819 05/07/19 0830  WBC 9.5  --   RBC 4.99  --   HGB 15.2 15.6  HCT 46.3 46.0  MCV 92.8  --   MCH 30.5  --   MCHC 32.8  --   RDW 13.6  --   PLT 264  --    Cardiac EnzymesNo results for input(s): TROPONINI in the last 168 hours. No results for input(s): TROPIPOC in the last 168 hours.  BNP Recent Labs  Lab 05/07/19 0819  BNP 31.9    DDimer No results for input(s): DDIMER in the last 168 hours.  Radiology/Studies:  Ct Abdomen Pelvis W Contrast  Result Date: 05/07/2019 CLINICAL DATA:  Abdominal distention. EXAM: CT ABDOMEN AND PELVIS WITH CONTRAST TECHNIQUE: Multidetector CT imaging of the abdomen and pelvis was performed using the standard protocol following bolus administration of intravenous contrast. CONTRAST:  139mL OMNIPAQUE IOHEXOL 300 MG/ML  SOLN COMPARISON:  CT scan dated 07/15/2017 FINDINGS: Lower chest: Normal. Hepatobiliary: No focal liver abnormality is seen. No gallstones, gallbladder wall thickening, or biliary dilatation. Pancreas: Unremarkable. No pancreatic ductal dilatation or surrounding inflammatory changes. Spleen: Normal in size without focal abnormality. Adrenals/Urinary Tract: 10 mm simple cyst on the upper pole of the right kidney. Kidneys, ureters, bladder, and adrenal glands are otherwise normal. Stomach/Bowel: The patient appears to have had resection of cecum. The stomach and small bowel are normal. Numerous diverticula in the sigmoid portion of the colon. No diverticulitis. Vascular/Lymphatic: Aortic atherosclerosis. No adenopathy. Reproductive: Prostate is unremarkable. Other: No  abdominal wall hernia or  abnormality. No abdominopelvic ascites. Musculoskeletal: No acute or significant osseous findings. IMPRESSION: 1. Benign-appearing abdomen and pelvis. 2. Diverticulosis of the sigmoid portion of the colon. 3. Aortic Atherosclerosis (ICD10-I70.0). Electronically Signed   By: Lorriane Shire M.D.   On: 05/07/2019 13:08   Xr Chest Single View  Result Date: 05/07/2019 CLINICAL DATA:  Shortness of breath EXAM: PORTABLE CHEST 1 VIEW COMPARISON:  August 12, 2017 FINDINGS: There is no edema or consolidation. Heart is upper normal in size with pulmonary vascularity normal. No adenopathy. There is degenerative change in the thoracic spine. IMPRESSION: No edema or consolidation.  Heart upper normal in size. Electronically Signed   By: Lowella Grip III M.D.   On: 05/07/2019 08:38    Assessment and Plan:   1. Troponemia, likely type 2 NSTEMI: This is a 63 year old male with a history of COPD, HTN, and lyme disease presenting with around a few month history of worsening SOB and chest pain that is associated with coughing, non-exertional, substernal, and no radiation. He awoke this morning feeling like he was unable to breath and was noted to be hypoxic down to the 70s. He was placed on BiPAP and treated with albuterol, steroids, and antibiotics for a possible COPD exacerbation. Noted to have elevated troponins trending upwards 108 > 372 > 426. CXR showed no acute findings. EKG showed stable LBBB and NSR, no ischemic changes. He does have a history of NICM with an EF of 35-40% noted on NM perfusion study in 08/2017. His chest pain is atypical in nature and his symptoms appear more respiratory in nature. Elevated troponin seems to be more consistent with a type 2 NSTEMI due to his respiratory illness. He also has a history of lyme disease which could be contributing to his CM however seems unlikely to be related to his acute elevation in troponin. Will repeat an echocardiogram to evaluate but will hold off on any  ischemic evaluation at this time.  -Repeat echocardiogram -Trend troponins -Continue treatment for COPD exacerbation per primary -Will need further ischemic evaluation but likely can be done outpatient, consider cardiac CT  -Will continue to follow  2. Acute hypoxic respiratory distress: Presenting with worsening SOB, productive cough, diarrhea, and occasional chest pain. Initially noted to be hypoxic down to the 70s and requiring BiPAP, treated with COPD medications and now weaned down to 2L. He does have diffuse wheezing on exam and appears ill. CXR showed no consolidations, or edema. He does have a history of CHF with an EF of 35-40%, does not appear fluid overloaded on exam. His rapid COVID test is negative. Unclear etiology of his respiratory issues however unlikely to be related to cardiac function, he appears euvolemic on exam.  -Would hold off on diuresis -COPD medications/antibiotics per primary  3. HFrEF: Echocardiogram in 2019 showed EF 35-40%, not on any medications at this time. Appears euvolemic on exam. Will need to be started on guideline directed therapy at some point.  -Repeating echocardiogram  4. HTN: Noted to have HTN on prior notes, not on any medications. BP has been stable.   For questions or updates, please contact [place] Please consult [email or web address] for contact info under Cardiology/STEMI.   Signed, Asencion Noble, MD  05/07/2019 1:31 PM  Agree with note by Dr. Lonia Skinner  63 year old moderately overweight Caucasian male with a history of nonischemic cardiomyopathy, EF in the 40% range by echo a year ago as well as a nonischemic Myoview who  presented with respiratory distress.  He is Covid negative by rapid test.  Chest x-ray shows no infiltrate.  His BNP is low.  He does not appear to be volume overloaded.  EKG shows left bundle branch block.  He was having chest pain but this was more so when he coughed.  He has mildly uptrending troponins which I  suspect is related to demand ischemia.  He responded to BiPAP.  He has diffuse wheezing on exam with bronchial breath sounds and no evidence of peripheral edema or increased JVD.  I favor primary pulmonary etiology.  Would repeat 2D echo, cycle enzymes and continue treatment for COPD exacerbation as you are doing.  We will follow along with you.  Lorretta Harp, M.D., [place], [facility], [place] [address] [place], [place]  [address]  [phone] 05/07/2019 5:48 PM

## 2019-05-07 NOTE — ED Notes (Signed)
Patient transported to CT 

## 2019-05-07 NOTE — Progress Notes (Signed)
  Echocardiogram 2D Echocardiogram has been performed.  Michael Love 05/07/2019, 4:02 PM

## 2019-05-07 NOTE — Progress Notes (Signed)
ANTICOAGULATION CONSULT NOTE - Initial Consult  Pharmacy Consult for Heparin Indication: chest pain/ACS/NSTEMI  No Known Allergies  Patient Measurements: Height: 6' (182.9 cm) Weight: 260 lb (117.9 kg) IBW/kg (Calculated) : 77.6 Heparin Dosing Weight: 103.3 kg   Vital Signs: Temp: 98 F (36.7 C) (11/20 1800) Temp Source: Oral (11/20 1800) BP: 123/89 (11/20 1800) Pulse Rate: 79 (11/20 1800)  Labs: Recent Labs    05/07/19 0819 05/07/19 0830 05/07/19 0959 05/07/19 1155  HGB 15.2 15.6  --   --   HCT 46.3 46.0  --   --   PLT 264  --   --   --   CREATININE 1.05  --   --   --   TROPONINIHS 108*  --  372* 426*    Estimated Creatinine Clearance: 94.2 mL/min (by C-G formula based on SCr of 1.05 mg/dL).   Medical History: Past Medical History:  Diagnosis Date  . Asthma    as a child  . Difficult intubation 2012   difficulty with intubation; had to use glide scope  . 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 Incarcerated inguinal hernia 05/19/2011  . Lyme disease   . Panic disorder 02/19/2018    Assessment: 63 yr old male with medical hx of panic disorder, thyroid storm and COPD presented to ED with acute respiratory failure. Pt has elevated troponins; echo showed EF 35-40% with regional wall motion abnormalities. Pharmacy consulted to dose heparin for NSTEMI.   Pt was on no anticoagulants PTA. CBC WNL. He rec'd Lovenox 40 mg SQ X 1 at 15:53 PM this afternoon.  Goal of Therapy:  Heparin level 0.3-0.7 units/ml Monitor platelets by anticoagulation protocol: Yes   Plan:  Start heparin infusion at 1250 units/hr (no bolus, due Lovenox dose rec'd at 15:53 PM) Check 6-hr heparin level Monitor daily heparin level, CBC Monitor for signs/symptoms of bleeding  Gillermina Hu, PharmD, BCPS, [facility] Clinical Pharmacist 05/07/2019,6:57 PM

## 2019-05-07 NOTE — ED Provider Notes (Signed)
[facility] Emergency Department Provider Note MRN:  [id number]  Arrival date & time: 05/07/19     Chief Complaint   Shortness of Breath   History of Present Illness   Michael Love is a 63 y.o. year-old male with a history of COPD, CHF presenting to the ED with chief complaint of shortness of breath.  1 month of persistent cough, shortness of breath for the past week, much worse overnight and this morning.  Patient was in significant respiratory distress upon EMS arrival, saturations in the 70s.  Improved with BiPAP in route.  Received albuterol, Solu-Medrol, magnesium.  Review of Systems  Positive for respiratory distress. 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 did not need surgery or treatment, pt has not had any medication for this condition since then   Incarcerated inguinal hernia 05/19/2011   Lyme disease    Panic disorder 02/19/2018    Past Surgical History:  Procedure Laterality Date   CYST REMOVAL HAND Left    index finger left hand   HERNIA REPAIR  1982   repaired hernia in upper abd   INGUINAL HERNIA REPAIR  05/21/2011   Procedure: HERNIA REPAIR INGUINAL ADULT;  Surgeon: Stark Klein, MD;  Location: [facility];  Service: General;  Laterality: Right;   LAPAROSCOPIC RIGHT HEMI COLECTOMY Right 08/18/2017   Procedure: LAPAROSCOPIC RIGHT HEMI COLECTOMY;  Surgeon: Herbert Pun, MD;  Location: [facility] ORS;  Service: General;  Laterality: Right;   ROTATOR CUFF REPAIR Right 2009   Dr Latanya Maudlin   TONSILLECTOMY      Family History  Problem Relation Age of Onset   Diabetes Mother    Cancer Father    Colon cancer Father    Cancer Brother     Social History   Socioeconomic History   Marital status: Married    Spouse name: Not  on file   Number of children: Not on file   Years of education: Not on file   Highest education level: Not on file  Occupational History   Not on file  Social Needs   Financial resource strain: Not on file   Food insecurity    Worry: Not on file    Inability: Not on file   Transportation needs    Medical: Not on file    Non-medical: Not on file  Tobacco Use   Smoking status: Former Smoker    Types: Cigarettes   Smokeless tobacco: Never Used  Substance and Sexual Activity   Alcohol use: No    Comment: 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; recovered alcoholic   Drug use: No   Sexual activity: Yes  Lifestyle   Physical activity    Days per week: Not on file    Minutes per session: Not on file   Stress: Not on file  Relationships   Social connections    Talks on phone: Not on file    Gets together: Not on file    Attends religious service: Not on file    Active member of club or organization: Not on file    Attends meetings of clubs or organizations: Not on file    Relationship status: Not on file   Intimate partner violence    Fear of current  or ex partner: Not on file    Emotionally abused: Not on file    Physically abused: Not on file    Forced sexual activity: Not on file  Other Topics Concern   Not on file  Social History Narrative   Not on file     Physical Exam  Vital Signs and Nursing Notes reviewed Vitals:   05/07/19 0815 05/07/19 0830  BP: 110/83 114/77  Pulse: 78 71  Resp: 13 12  Temp:    SpO2: 99% 99%    CONSTITUTIONAL: Ill -appearing, in moderate respiratory stress NEURO:  Alert and oriented x 3, no focal deficits EYES:  eyes equal and reactive ENT/NECK:  no LAD, no JVD CARDIO: Regular rate, well-perfused, normal S1 and S2 PULM: Scattered wheezes and rhonchi, tachypneic GI/GU:  normal bowel sounds, non-distended, non-tender MSK/SPINE:  No gross deformities, no edema SKIN:  no rash, atraumatic PSYCH:  Appropriate speech and  behavior  Diagnostic and Interventional Summary    EKG Interpretation  Date/Time:  Friday May 07 2019 08:02:54 EST Ventricular Rate:  82 PR Interval:    QRS Duration: 173 QT Interval:  436 QTC Calculation: 510 R Axis:   -68 Text Interpretation: Sinus rhythm Atrial premature complex Left bundle branch block No significant change was found Confirmed by Gerlene Fee [phone]) on 05/07/2019 8:09:53 AM      Labs Reviewed  COMPREHENSIVE METABOLIC PANEL - Abnormal; Notable for the following components:      Result Value   Potassium 3.4 (*)    Glucose, Bld 139 (*)    BUN 6 (*)    Calcium 8.8 (*)    AST 60 (*)    ALT 54 (*)    All other components within normal limits  LACTIC ACID, PLASMA - Abnormal; Notable for the following components:   Lactic Acid, Venous 2.0 (*)    All other components within normal limits  POCT I-STAT EG7 - Abnormal; Notable for the following components:   pCO2, Ven 43.2 (*)    pO2, Ven 169.0 (*)    Calcium, Ion 1.12 (*)    All other components within normal limits  TROPONIN I (HIGH SENSITIVITY) - Abnormal; Notable for the following components:   Troponin I (High Sensitivity) 108 (*)    All other components within normal limits  CULTURE, BLOOD (SINGLE)  SARS CORONAVIRUS 2 (TAT 6-24 HRS)  BRAIN NATRIURETIC PEPTIDE  CBC  POC SARS CORONAVIRUS 2 AG -  ED    XR Chest Single View  Final Result      Medications  albuterol (VENTOLIN HFA) 108 (90 Base) MCG/ACT inhaler 2 puff (2 puffs Inhalation Given 05/07/19 0920)     Procedures  /  Critical Care .Critical Care Performed by: Maudie Flakes, MD Authorized by: Maudie Flakes, MD   Critical care provider statement:    Critical care time (minutes):  35   Critical care was necessary to treat or prevent imminent or life-threatening deterioration of the following conditions:  Respiratory failure   Critical care was time spent personally by me on the following activities:  Discussions with consultants,  evaluation of patient's response to treatment, examination of patient, ordering and performing treatments and interventions, ordering and review of laboratory studies, ordering and review of radiographic studies, pulse oximetry, re-evaluation of patient's condition, obtaining history from patient or surrogate and review of old charts    ED Course and Medical Decision Making  I have reviewed the triage vital signs and the nursing notes.  Pertinent  labs & imaging results that were available during my care of the patient were reviewed by me and considered in my medical decision making (see below for details).     Concern for CHF exacerbation versus COPD exacerbation versus pneumonia versus COVID-19, patient seems to have improved significantly with EMS interventions, currently requiring 4 L nasal cannula, which is a new oxygen requirement.  Anticipating admission.  Patient is without evidence of DVT on exam, low concern for PE at this time.  Work-up reveals a normal chest x-ray, troponin elevated at 100, but given the unchanged EKG this is still thought to be type II in nature due to the underlying likely COPD exacerbation.  BNP is normal.  Given the diffuse wheezing and concern for COPD, will admit to hospitalist service.  Patient's rapid antigen coronavirus testing was negative, will follow up formal test.  Barth Kirks. Sedonia Small, [place] [email or web address]  Final Clinical Impressions(s) / ED Diagnoses     ICD-10-CM   1. COPD exacerbation ([place])  J44.1   2. SOB (shortness of breath)  R06.02 XR Chest Single View    XR Chest Single View  3. Elevated troponin  R77.8     ED Discharge Orders    None       Discharge Instructions Discussed with and Provided to Patient:   Discharge Instructions   None       Maudie Flakes, MD 05/07/19 [phone]

## 2019-05-07 NOTE — ED Triage Notes (Signed)
Pt BIB [facility] w/ c/o SOB. Cough for 2.5 months, negative COVID 1 month ago, per EMS. Wheezing throughout, O2 79% when discovered by EMS, put on CPAP. Now at 100% on 6 L [place]. 1 duo given by EMS. Pt denies hx of COPD, asthma. Patient A&O x4, wheezing and SOB.

## 2019-05-07 NOTE — Progress Notes (Addendum)
RN called for RT to come evaluate pt who arrived via EMS on a CPAP mask with neb on 15L. SATs were 100% on CPAP.  Pt has complaints of SOB and said he woke up this morning and wasn't able to catch his breath. SATs 79% on EMS arrival at home. Pt does not wear O2 at home. He states he has "a bad heart". Pt removed from CPAP and placed on 6L [place]. SATs remained 100%. RR 18-22. HR stable. BBS have a slight inspiratory and expiratory wheeze. MD notified and ordered Albuterol 2 puffs MDI. RN aware and will perform a rapid COVID test. RT will be available should pt need further treatment.

## 2019-05-07 NOTE — ED Notes (Signed)
ED TO INPATIENT HANDOFF REPORT  ED Nurse Name and Phone #: Zelphia Cairo Alba Name/Age/Gender Michael Love 63 y.o. male Room/Bed: 024C/024C  Code Status   Code Status: DNR  Home/SNF/Other Home Patient oriented to: self, place, time and situation Is this baseline? Yes   Triage Complete: Triage complete  Chief Complaint sob cpap  Triage Note Pt BIB [facility] w/ c/o SOB. Cough for 2.5 months, negative COVID 1 month ago, per EMS. Wheezing throughout, O2 79% when discovered by EMS, put on CPAP. Now at 100% on 6 L [place]. 1 duo given by EMS. Pt denies hx of COPD, asthma. Patient A&O x4, wheezing and SOB.   Allergies No Known Allergies  Level of Care/Admitting Diagnosis ED Disposition    ED Disposition Condition [facility] Area: [place] [[id number]]  Level of Care: Telemetry Medical [104]  I expect the patient will be discharged within 24 hours: No (not a candidate for 5C-Observation unit)  Covid Evaluation: Confirmed COVID Negative  Diagnosis: Acute respiratory failure ([place]) [518.81.ICD-9-CM]  Admitting Physician: Karmen Bongo [[id number]]  Attending Physician: Karmen Bongo [[id number]]  PT Class (Do Not Modify): Observation [104]  PT Acc Code (Do Not Modify): Observation [10022]       B Medical/Surgery History Past Medical History:  Diagnosis Date  . Asthma    as a child  . Difficult intubation 2012   difficulty with intubation; had to use glide scope  . 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 Incarcerated inguinal hernia 05/19/2011  . Lyme disease   . Panic disorder 02/19/2018   Past Surgical History:  Procedure Laterality Date  . CYST REMOVAL HAND Left    index finger left hand  . HERNIA REPAIR  1982   repaired hernia in upper abd  . INGUINAL HERNIA REPAIR  05/21/2011   Procedure: HERNIA REPAIR INGUINAL  ADULT;  Surgeon: Stark Klein, MD;  Location: [facility];  Service: General;  Laterality: Right;  . LAPAROSCOPIC RIGHT HEMI COLECTOMY Right 08/18/2017   Procedure: LAPAROSCOPIC RIGHT HEMI COLECTOMY;  Surgeon: Herbert Pun, MD;  Location: [facility] ORS;  Service: General;  Laterality: Right;  . ROTATOR CUFF REPAIR Right 2009   Dr Latanya Maudlin  . TONSILLECTOMY       A IV Location/Drains/Wounds Patient Lines/Drains/Airways Status   Active Line/Drains/Airways    Name:   Placement date:   Placement time:   Site:   Days:   Peripheral IV 05/07/19 Left Antecubital   05/07/19    -    Antecubital   less than 1   Incision 05/21/11 Abdomen Right   05/21/11    0811     2908   Incision (Closed) 08/18/17 Abdomen Other (Comment)   08/18/17    1050     627   Incision (Closed) 08/18/17 Abdomen Other (Comment)   08/18/17    1050     627   Incision - 3 Ports Abdomen 1: Umbilicus 2: Upper;Mid Upper;Left   08/18/17    0800     627          Intake/Output Last 24 hours  Intake/Output Summary (Last 24 hours) at 05/07/2019 1729 Last data filed at 05/07/2019 1554 Gross per 24 hour  Intake 3 ml  Output -  Net 3 ml    Labs/Imaging Results for orders placed or  performed during the hospital encounter of 05/07/19 (from the past 48 hour(s))  BNP     Status: None   Collection Time: 05/07/19  8:19 AM  Result Value Ref Range   B Natriuretic Peptide 31.9 0.0 - 100.0 pg/mL    Comment: Performed at [facility], [address], [place], [place] [address]  Troponin     Status: Abnormal   Collection Time: 05/07/19  8:19 AM  Result Value Ref Range   Troponin I (High Sensitivity) 108 (HH) <18 ng/L    Comment: CRITICAL RESULT CALLED TO, READ BACK BY AND VERIFIED WITH: NICOLE KOONTZ,RN AT 0920 05/07/2019 BY ZBEECH. (NOTE) Elevated high sensitivity troponin I (hsTnI) values and significant  changes across serial measurements may suggest ACS but many other  chronic and acute conditions are known to elevate hsTnI  results.  Refer to the Links section for chest pain algorithms and additional  guidance. Performed at [facility], [place] [address] [place] [address]   CBC     Status: None   Collection Time: 05/07/19  8:19 AM  Result Value Ref Range   WBC 9.5 4.0 - 10.5 K/uL   RBC 4.99 4.22 - 5.81 MIL/uL   Hemoglobin 15.2 13.0 - 17.0 g/dL   HCT 46.3 39.0 - 52.0 %   MCV 92.8 80.0 - 100.0 fL   MCH 30.5 26.0 - 34.0 pg   MCHC 32.8 30.0 - 36.0 g/dL   RDW 13.6 11.5 - 15.5 %   Platelets 264 150 - 400 K/uL   nRBC 0.0 0.0 - 0.2 %    Comment: Performed at [facility], [place] [address] [place], [place] [address]  CMP     Status: Abnormal   Collection Time: 05/07/19  8:19 AM  Result Value Ref Range   Sodium 137 135 - 145 mmol/L   Potassium 3.4 (L) 3.5 - 5.1 mmol/L   Chloride 103 98 - 111 mmol/L   CO2 22 22 - 32 mmol/L   Glucose, Bld 139 (H) 70 - 99 mg/dL   BUN 6 (L) 8 - 23 mg/dL   Creatinine, Ser 1.05 0.61 - 1.24 mg/dL   Calcium 8.8 (L) 8.9 - 10.3 mg/dL   Total Protein 6.9 6.5 - 8.1 g/dL   Albumin 3.9 3.5 - 5.0 g/dL   AST 60 (H) 15 - 41 U/L   ALT 54 (H) 0 - 44 U/L   Alkaline Phosphatase 60 38 - 126 U/L   Total Bilirubin 0.5 0.3 - 1.2 mg/dL   GFR calc non Af Amer >60 >60 mL/min   GFR calc Af Amer >60 >60 mL/min   Anion gap 12 5 - 15    Comment: Performed at [facility], [place] [address] [place], [place] [address]  Lactic acid     Status: Abnormal   Collection Time: 05/07/19  8:19 AM  Result Value Ref Range   Lactic Acid, Venous 2.0 (HH) 0.5 - 1.9 mmol/L    Comment: CRITICAL RESULT CALLED TO, READ BACK BY AND VERIFIED WITH: NICOLE KOONTZ,RN AT 3244 05/07/2019 BY ZBEECH. Performed at [facility], [place] [address] [place], [place] [address]   Blood Culture x 1     Status: None (Preliminary result)   Collection Time: 05/07/19  8:19 AM   Specimen: BLOOD LEFT HAND  Result Value Ref Range   Specimen Description BLOOD LEFT HAND    Special Requests      BOTTLES DRAWN  AEROBIC  AND ANAEROBIC Blood Culture adequate volume   Culture      NO GROWTH < 12 HOURS Performed at [facility] [address] [place], [place] [address]    Report Status PENDING   POCT I-Stat EG7     Status: Abnormal   Collection Time: 05/07/19  8:30 AM  Result Value Ref Range   pH, Ven 7.384 7.250 - 7.430   pCO2, Ven 43.2 (L) 44.0 - 60.0 mmHg   pO2, Ven 169.0 (H) 32.0 - 45.0 mmHg   Bicarbonate 25.8 20.0 - 28.0 mmol/L   TCO2 27 22 - 32 mmol/L   O2 Saturation 99.0 %   Sodium 139 135 - 145 mmol/L   Potassium 3.6 3.5 - 5.1 mmol/L   Calcium, Ion 1.12 (L) 1.15 - 1.40 mmol/L   HCT 46.0 39.0 - 52.0 %   Hemoglobin 15.6 13.0 - 17.0 g/dL   Patient temperature HIDE    Sample type VENOUS   POC SARS Coronavirus 2 Ag-ED - Nasal Swab (BD Veritor Kit)     Status: None   Collection Time: 05/07/19  8:42 AM  Result Value Ref Range   SARS Coronavirus 2 Ag NEGATIVE NEGATIVE    Comment: (NOTE) SARS-CoV-2 antigen NOT DETECTED.  Negative results are presumptive.  Negative results do not preclude SARS-CoV-2 infection and should not be used as the sole basis for treatment or other patient management decisions, including infection  control decisions, particularly in the presence of clinical signs and  symptoms consistent with COVID-19, or in those who have been in contact with the virus.  Negative results must be combined with clinical observations, patient history, and epidemiological information. The expected result is Negative. Fact Sheet for Patients: PodPark.tn Fact Sheet for Healthcare Providers: GiftContent.is This test is not yet approved or cleared by the [place] FDA and  has been authorized for detection and/or diagnosis of SARS-CoV-2 by FDA under an Emergency Use Authorization (EUA).  This EUA will remain in effect (meaning this test can be used) for the duration of  the COVID-19 de claration under Section  564(b)(1) of the Act, 21 U.S.C. section 360bbb-3(b)(1), unless the authorization is terminated or revoked sooner.   SARS CORONAVIRUS 2 (TAT 6-24 HRS) Nasopharyngeal Nasopharyngeal Swab     Status: None   Collection Time: 05/07/19  9:34 AM   Specimen: Nasopharyngeal Swab  Result Value Ref Range   SARS Coronavirus 2 NEGATIVE NEGATIVE    Comment: (NOTE) SARS-CoV-2 target nucleic acids are NOT DETECTED. The SARS-CoV-2 RNA is generally detectable in upper and lower respiratory specimens during the acute phase of infection. Negative results do not preclude SARS-CoV-2 infection, do not rule out co-infections with other pathogens, and should not be used as the sole basis for treatment or other patient management decisions. Negative results must be combined with clinical observations, patient history, and epidemiological information. The expected result is Negative. Fact Sheet for Patients: SugarRoll.be Fact Sheet for Healthcare Providers: https://www.woods-mathews.com/ This test is not yet approved or cleared by the [place] FDA and  has been authorized for detection and/or diagnosis of SARS-CoV-2 by FDA under an Emergency Use Authorization (EUA). This EUA will remain  in effect (meaning this test can be used) for the duration of the COVID-19 declaration under Section 56 4(b)(1) of the Act, 21 U.S.C. section 360bbb-3(b)(1), unless the authorization is terminated or revoked sooner. Performed at [facility], [place] [address] [place],  [address]   Troponin     Status: Abnormal  Collection Time: 05/07/19  9:59 AM  Result Value Ref Range   Troponin I (High Sensitivity) 372 (HH) <18 ng/L    Comment: CRITICAL VALUE NOTED.  VALUE IS CONSISTENT WITH PREVIOUSLY REPORTED AND CALLED VALUE. (NOTE) Elevated high sensitivity troponin I (hsTnI) values and significant  changes across serial measurements may suggest ACS but many other   chronic and acute conditions are known to elevate hsTnI results.  Refer to the Links section for chest pain algorithms and additional  guidance. Performed at [facility], [place] [address], [place], [place] [address]   Lactic acid, plasma     Status: Abnormal   Collection Time: 05/07/19 11:55 AM  Result Value Ref Range   Lactic Acid, Venous 2.0 (HH) 0.5 - 1.9 mmol/L    Comment: CRITICAL VALUE NOTED.  VALUE IS CONSISTENT WITH PREVIOUSLY REPORTED AND CALLED VALUE. Performed at [facility], [place] [address], [place], [place] [address]   Respiratory Panel by PCR     Status: None   Collection Time: 05/07/19 11:55 AM   Specimen: Nasopharyngeal Swab; Respiratory  Result Value Ref Range   Adenovirus NOT DETECTED NOT DETECTED   Coronavirus 229E NOT DETECTED NOT DETECTED    Comment: (NOTE) The Coronavirus on the Respiratory Panel, DOES NOT test for the novel  Coronavirus (2019 nCoV)    Coronavirus HKU1 NOT DETECTED NOT DETECTED   Coronavirus NL63 NOT DETECTED NOT DETECTED   Coronavirus OC43 NOT DETECTED NOT DETECTED   Metapneumovirus NOT DETECTED NOT DETECTED   Rhinovirus / Enterovirus NOT DETECTED NOT DETECTED   Influenza A NOT DETECTED NOT DETECTED   Influenza B NOT DETECTED NOT DETECTED   Parainfluenza Virus 1 NOT DETECTED NOT DETECTED   Parainfluenza Virus 2 NOT DETECTED NOT DETECTED   Parainfluenza Virus 3 NOT DETECTED NOT DETECTED   Parainfluenza Virus 4 NOT DETECTED NOT DETECTED   Respiratory Syncytial Virus NOT DETECTED NOT DETECTED   Bordetella pertussis NOT DETECTED NOT DETECTED   Chlamydophila pneumoniae NOT DETECTED NOT DETECTED   Mycoplasma pneumoniae NOT DETECTED NOT DETECTED    Comment: Performed at [facility], [place] [address], [place], [place] [address]  Troponin I (High Sensitivity)     Status: Abnormal   Collection Time: 05/07/19 11:55 AM  Result Value Ref Range   Troponin I (High Sensitivity) 426 (HH) <18 ng/L    Comment: CRITICAL VALUE NOTED.   VALUE IS CONSISTENT WITH PREVIOUSLY REPORTED AND CALLED VALUE. (NOTE) Elevated high sensitivity troponin I (hsTnI) values and significant  changes across serial measurements may suggest ACS but many other  chronic and acute conditions are known to elevate hsTnI results.  Refer to the Links section for chest pain algorithms and additional  guidance. Performed at [facility], [place] [address] [place], [place] [address]   Lactic acid, plasma     Status: Abnormal   Collection Time: 05/07/19  3:34 PM  Result Value Ref Range   Lactic Acid, Venous 3.4 (HH) 0.5 - 1.9 mmol/L    Comment: CRITICAL RESULT CALLED TO, READ BACK BY AND VERIFIED WITH: G Allea Kassner,RN 1624 05/07/2019 D BRADLEY Performed at [facility], [place] [address] [place], [place] [address]   Procalcitonin - Baseline     Status: None   Collection Time: 05/07/19  3:34 PM  Result Value Ref Range   Procalcitonin <0.10 ng/mL    Comment:        Interpretation: PCT (Procalcitonin) <= 0.5 ng/mL: Systemic infection (sepsis) is not  likely. Local bacterial infection is possible. (NOTE)       Sepsis PCT Algorithm           Lower Respiratory Tract                                      Infection PCT Algorithm    ----------------------------     ----------------------------         PCT < 0.25 ng/mL                PCT < 0.10 ng/mL         Strongly encourage             Strongly discourage   discontinuation of antibiotics    initiation of antibiotics    ----------------------------     -----------------------------       PCT 0.25 - 0.50 ng/mL            PCT 0.10 - 0.25 ng/mL               OR       >80% decrease in PCT            Discourage initiation of                                            antibiotics      Encourage discontinuation           of antibiotics    ----------------------------     -----------------------------         PCT >= 0.50 ng/mL              PCT 0.26 - 0.50 ng/mL               AND        <80% decrease in  PCT             Encourage initiation of                                             antibiotics       Encourage continuation           of antibiotics    ----------------------------     -----------------------------        PCT >= 0.50 ng/mL                  PCT > 0.50 ng/mL               AND         increase in PCT                  Strongly encourage                                      initiation of antibiotics    Strongly encourage escalation           of antibiotics                                     -----------------------------  PCT <= 0.25 ng/mL                                                 OR                                        > 80% decrease in PCT                                     Discontinue / Do not initiate                                             antibiotics Performed at [facility], [place] [address], [place], [place] [address]    Ct Chest W Contrast  Result Date: 05/07/2019 CLINICAL DATA:  Shortness of breath, fever, acute respiratory illness, persistent cough EXAM: CT CHEST WITH CONTRAST TECHNIQUE: Multidetector CT imaging of the chest was performed during intravenous contrast administration. Sagittal and coronal MPR images reconstructed from axial data set. CONTRAST:  62m OMNIPAQUE IOHEXOL 300 MG/ML  SOLN IV. COMPARISON:  None FINDINGS: Cardiovascular: Aorta normal caliber without aneurysm or dissection on non dedicated exam. Mild atherosclerotic calcifications at the coronary arteries. Heart unremarkable. No pericardial effusion. Mediastinum/Nodes: Base of cervical region normal appearance. Esophagus normal appearance. Scattered normal sized mediastinal lymph nodes. No thoracic adenopathy. Lungs/Pleura: Minimal subsegmental atelectasis in lower lobes. Lungs otherwise clear. No pulmonary infiltrate, pleural effusion, pneumothorax or mass. Upper Abdomen: Probable fatty infiltration of liver. Remaining visualized upper  abdomen unremarkable. Musculoskeletal: No acute osseous findings. IMPRESSION: No acute intrathoracic abnormalities. Minimal coronary arterial calcification and subsegmental atelectasis in lower lobes. Electronically Signed   By: MLavonia DanaM.D.   On: 05/07/2019 14:46   Ct Abdomen Pelvis W Contrast  Result Date: 05/07/2019 CLINICAL DATA:  Abdominal distention. EXAM: CT ABDOMEN AND PELVIS WITH CONTRAST TECHNIQUE: Multidetector CT imaging of the abdomen and pelvis was performed using the standard protocol following bolus administration of intravenous contrast. CONTRAST:  1086mOMNIPAQUE IOHEXOL 300 MG/ML  SOLN COMPARISON:  CT scan dated 07/15/2017 FINDINGS: Lower chest: Normal. Hepatobiliary: No focal liver abnormality is seen. No gallstones, gallbladder wall thickening, or biliary dilatation. Pancreas: Unremarkable. No pancreatic ductal dilatation or surrounding inflammatory changes. Spleen: Normal in size without focal abnormality. Adrenals/Urinary Tract: 10 mm simple cyst on the upper pole of the right kidney. Kidneys, ureters, bladder, and adrenal glands are otherwise normal. Stomach/Bowel: The patient appears to have had resection of cecum. The stomach and small bowel are normal. Numerous diverticula in the sigmoid portion of the colon. No diverticulitis. Vascular/Lymphatic: Aortic atherosclerosis. No adenopathy. Reproductive: Prostate is unremarkable. Other: No abdominal wall hernia or abnormality. No abdominopelvic ascites. Musculoskeletal: No acute or significant osseous findings. IMPRESSION: 1. Benign-appearing abdomen and pelvis. 2. Diverticulosis of the sigmoid portion of the colon. 3. Aortic Atherosclerosis (ICD10-I70.0). Electronically Signed   By: JaLorriane Shire.D.   On: 05/07/2019 13:08   Xr Chest Single View  Result Date: 05/07/2019 CLINICAL DATA:  Shortness of breath EXAM: PORTABLE CHEST 1 VIEW COMPARISON:  August 12, 2017  FINDINGS: There is no edema or consolidation. Heart is upper  normal in size with pulmonary vascularity normal. No adenopathy. There is degenerative change in the thoracic spine. IMPRESSION: No edema or consolidation.  Heart upper normal in size. Electronically Signed   By: Lowella Grip III M.D.   On: 05/07/2019 08:38    Pending Labs Unresulted Labs (From admission, onward)    Start     Ordered   05/08/19 1275  Basic metabolic panel  Tomorrow morning,   R     05/07/19 1409   05/08/19 0500  CBC  Tomorrow morning,   R     05/07/19 1409   05/07/19 1730  Lactic acid, plasma  STAT Now then every 3 hours,   R (with STAT occurrences)     05/07/19 1631   05/07/19 1408  HIV Antibody (routine testing w rflx)  (HIV Antibody (Routine testing w reflex) panel)  Once,   STAT     05/07/19 1409   05/07/19 1124  Occult blood card to lab, stool RN will collect  Once,   STAT    Question:  Specimen to be collected by:  Answer:  RN will collect   05/07/19 1124          Vitals/Pain Today's Vitals   05/07/19 1552 05/07/19 1615 05/07/19 1645 05/07/19 1700  BP: 129/89 115/80 128/61 113/66  Pulse: 80 77 73 72  Resp: 18 18 (!) 21 (!) 23  Temp:      TempSrc:      SpO2: 99% 97% 97% 97%  Weight:      Height:      PainSc:        Isolation Precautions No active isolations  Medications Medications  HYDROcodone-acetaminophen (NORCO/VICODIN) 5-325 MG per tablet 1 tablet (has no administration in time range)  clonazePAM (KLONOPIN) tablet 1 mg (has no administration in time range)  enoxaparin (LOVENOX) injection 40 mg (40 mg Subcutaneous Given 05/07/19 1553)  lactated ringers infusion ( Intravenous New Bag/Given 05/07/19 1549)  methylPREDNISolone sodium succinate (SOLU-MEDROL) 125 mg/2 mL injection 60 mg (60 mg Intravenous Given 05/07/19 1544)    Followed by  predniSONE (DELTASONE) tablet 40 mg (has no administration in time range)  sodium chloride flush (NS) 0.9 % injection 3 mL (3 mLs Intravenous Given 05/07/19 1554)  Ipratropium-Albuterol (COMBIVENT)  respimat 1 puff (has no administration in time range)  albuterol (VENTOLIN HFA) 108 (90 Base) MCG/ACT inhaler 2 puff (has no administration in time range)  albuterol (VENTOLIN HFA) 108 (90 Base) MCG/ACT inhaler 2 puff (2 puffs Inhalation Given 05/07/19 0920)  iohexol (OMNIPAQUE) 300 MG/ML solution 100 mL (100 mLs Intravenous Contrast Given 05/07/19 1251)  iohexol (OMNIPAQUE) 300 MG/ML solution 75 mL (75 mLs Intravenous Contrast Given 05/07/19 1420)  sodium chloride 0.9 % bolus 1,000 mL (1,000 mLs Intravenous New Bag/Given 05/07/19 1719)    Mobility walks with person assist Low fall risk   Focused Assessments Pulmonary Assessment Handoff:  Lung sounds: Bilateral Breath Sounds: Expiratory wheezes L Breath Sounds: Expiratory wheezes R Breath Sounds: Expiratory wheezes O2 Device: Nasal Cannula O2 Flow Rate (L/min): 2 L/min      R Recommendations: See Admitting Provider Note  Report given to:   Additional Notes:

## 2019-05-07 NOTE — Plan of Care (Signed)
  Problem: Education: Goal: Knowledge of General Education information will improve Description: Including pain rating scale, medication(s)/side effects and non-pharmacologic comfort measures Outcome: Progressing   Problem: Health Behavior/Discharge Planning: Goal: Ability to manage health-related needs will improve Outcome: Progressing   Problem: Clinical Measurements: Goal: Ability to maintain clinical measurements within normal limits will improve Outcome: Progressing Goal: Will remain free from infection Outcome: Progressing Goal: Diagnostic test results will improve Outcome: Progressing Goal: Respiratory complications will improve Outcome: Progressing Goal: Cardiovascular complication will be avoided Outcome: Progressing   Problem: Activity: Goal: Risk for activity intolerance will decrease Outcome: Progressing   Problem: Nutrition: Goal: Adequate nutrition will be maintained Outcome: Progressing   Problem: Coping: Goal: Level of anxiety will decrease Outcome: Progressing   Problem: Elimination: Goal: Will not experience complications related to bowel motility Outcome: Progressing Goal: Will not experience complications related to urinary retention Outcome: Progressing   Problem: Pain Managment: Goal: General experience of comfort will improve Description: Patient has chronic back pain due to a fall months ago. MD notified and gave 2mg  IV morphine and has hydrocodone scheduled for 2200. Outcome: Progressing Note: Patient has chronic back pain due to a fall from a ladder months ago.MD notified and has been give 2 mg IV morphine and has scheduled hydrocodone due at 2200.   Problem: Safety: Goal: Ability to remain free from injury will improve Outcome: Progressing   Problem: Skin Integrity: Goal: Risk for impaired skin integrity will decrease Outcome: Progressing   Problem: Education: Goal: Ability to demonstrate management of disease process will  improve Outcome: Progressing Goal: Ability to verbalize understanding of medication therapies will improve Outcome: Progressing Goal: Individualized Educational Video(s) Outcome: Progressing   Problem: Activity: Goal: Capacity to carry out activities will improve Outcome: Progressing   Problem: Cardiac: Goal: Ability to achieve and maintain adequate cardiopulmonary perfusion will improve Outcome: Progressing

## 2019-05-08 DIAGNOSIS — R778 Other specified abnormalities of plasma proteins: Secondary | ICD-10-CM | POA: Diagnosis not present

## 2019-05-08 DIAGNOSIS — J209 Acute bronchitis, unspecified: Secondary | ICD-10-CM

## 2019-05-08 DIAGNOSIS — J9601 Acute respiratory failure with hypoxia: Secondary | ICD-10-CM | POA: Diagnosis present

## 2019-05-08 DIAGNOSIS — J440 Chronic obstructive pulmonary disease with acute lower respiratory infection: Secondary | ICD-10-CM | POA: Diagnosis present

## 2019-05-08 DIAGNOSIS — Z833 Family history of diabetes mellitus: Secondary | ICD-10-CM | POA: Diagnosis not present

## 2019-05-08 DIAGNOSIS — Z6835 Body mass index (BMI) 35.0-35.9, adult: Secondary | ICD-10-CM | POA: Diagnosis not present

## 2019-05-08 DIAGNOSIS — E876 Hypokalemia: Secondary | ICD-10-CM | POA: Diagnosis present

## 2019-05-08 DIAGNOSIS — Z20828 Contact with and (suspected) exposure to other viral communicable diseases: Secondary | ICD-10-CM | POA: Diagnosis present

## 2019-05-08 DIAGNOSIS — E785 Hyperlipidemia, unspecified: Secondary | ICD-10-CM | POA: Diagnosis present

## 2019-05-08 DIAGNOSIS — I248 Other forms of acute ischemic heart disease: Secondary | ICD-10-CM | POA: Diagnosis present

## 2019-05-08 DIAGNOSIS — Z87891 Personal history of nicotine dependence: Secondary | ICD-10-CM | POA: Diagnosis not present

## 2019-05-08 DIAGNOSIS — I110 Hypertensive heart disease with heart failure: Secondary | ICD-10-CM | POA: Diagnosis present

## 2019-05-08 DIAGNOSIS — F410 Panic disorder [episodic paroxysmal anxiety] without agoraphobia: Secondary | ICD-10-CM | POA: Diagnosis present

## 2019-05-08 DIAGNOSIS — J441 Chronic obstructive pulmonary disease with (acute) exacerbation: Secondary | ICD-10-CM | POA: Diagnosis present

## 2019-05-08 DIAGNOSIS — Z800 Family history of malignant neoplasm of digestive organs: Secondary | ICD-10-CM | POA: Diagnosis not present

## 2019-05-08 DIAGNOSIS — Z8619 Personal history of other infectious and parasitic diseases: Secondary | ICD-10-CM | POA: Diagnosis not present

## 2019-05-08 DIAGNOSIS — I5023 Acute on chronic systolic (congestive) heart failure: Secondary | ICD-10-CM | POA: Diagnosis present

## 2019-05-08 DIAGNOSIS — I428 Other cardiomyopathies: Secondary | ICD-10-CM | POA: Diagnosis present

## 2019-05-08 DIAGNOSIS — Z79899 Other long term (current) drug therapy: Secondary | ICD-10-CM | POA: Diagnosis not present

## 2019-05-08 DIAGNOSIS — F459 Somatoform disorder, unspecified: Secondary | ICD-10-CM | POA: Diagnosis present

## 2019-05-08 DIAGNOSIS — Z66 Do not resuscitate: Secondary | ICD-10-CM | POA: Diagnosis present

## 2019-05-08 DIAGNOSIS — K219 Gastro-esophageal reflux disease without esophagitis: Secondary | ICD-10-CM | POA: Diagnosis present

## 2019-05-08 DIAGNOSIS — R0602 Shortness of breath: Secondary | ICD-10-CM | POA: Diagnosis present

## 2019-05-08 DIAGNOSIS — Z9049 Acquired absence of other specified parts of digestive tract: Secondary | ICD-10-CM | POA: Diagnosis not present

## 2019-05-08 DIAGNOSIS — E669 Obesity, unspecified: Secondary | ICD-10-CM | POA: Diagnosis present

## 2019-05-08 LAB — BASIC METABOLIC PANEL
Anion gap: 10 (ref 5–15)
BUN: 6 mg/dL — ABNORMAL LOW (ref 8–23)
CO2: 24 mmol/L (ref 22–32)
Calcium: 9 mg/dL (ref 8.9–10.3)
Chloride: 105 mmol/L (ref 98–111)
Creatinine, Ser: 1.01 mg/dL (ref 0.61–1.24)
GFR calc Af Amer: >60 mL/min (ref >60)
GFR calc non Af Amer: >60 mL/min (ref >60)
Glucose, Bld: 142 mg/dL — ABNORMAL HIGH (ref 70–99)
Potassium: 4.3 mmol/L (ref 3.5–5.1)
Sodium: 139 mmol/L (ref 135–145)

## 2019-05-08 LAB — CBC
HCT: 42.5 % (ref 39.0–52.0)
Hemoglobin: 14.1 g/dL (ref 13.0–17.0)
MCH: 30.1 pg (ref 26.0–34.0)
MCHC: 33.2 g/dL (ref 30.0–36.0)
MCV: 90.8 fL (ref 80.0–100.0)
Platelets: 280 10*3/uL (ref 150–400)
RBC: 4.68 MIL/uL (ref 4.22–5.81)
RDW: 13.7 % (ref 11.5–15.5)
WBC: 10.7 10*3/uL — ABNORMAL HIGH (ref 4.0–10.5)
nRBC: 0 % (ref 0.0–0.2)

## 2019-05-08 LAB — BRAIN NATRIURETIC PEPTIDE: B Natriuretic Peptide: 612.6 pg/mL — ABNORMAL HIGH (ref 0.0–100.0)

## 2019-05-08 LAB — HEPARIN LEVEL (UNFRACTIONATED)
Heparin Unfractionated: 0.49 IU/mL (ref 0.30–0.70)
Heparin Unfractionated: 0.3 IU/mL (ref 0.30–0.70)

## 2019-05-08 LAB — OCCULT BLOOD X 1 CARD TO LAB, STOOL: Fecal Occult Bld: NEGATIVE

## 2019-05-08 LAB — LACTIC ACID, PLASMA: Lactic Acid, Venous: 3.7 mmol/L (ref 0.5–1.9)

## 2019-05-08 LAB — TROPONIN I (HIGH SENSITIVITY): Troponin I (High Sensitivity): 584 ng/L (ref <18)

## 2019-05-08 MED ORDER — FUROSEMIDE 10 MG/ML IJ SOLN
40.0000 mg | Freq: Once | INTRAMUSCULAR | Status: AC
  Administered 2019-05-08: 40 mg via INTRAVENOUS
  Filled 2019-05-08: qty 4

## 2019-05-08 MED ORDER — ACETAMINOPHEN 325 MG PO TABS: 650.0000 mg | ORAL_TABLET | Freq: Four times a day (QID) | ORAL | Status: DC | PRN

## 2019-05-08 MED ORDER — IPRATROPIUM-ALBUTEROL 0.5-2.5 (3) MG/3ML IN SOLN
3.0000 mL | Freq: Three times a day (TID) | RESPIRATORY_TRACT | Status: DC
  Administered 2019-05-09 – 2019-05-10 (×4): 3 mL via RESPIRATORY_TRACT
  Filled 2019-05-08 (×4): qty 3

## 2019-05-08 MED ORDER — FLUTICASONE PROPIONATE 50 MCG/ACT NA SUSP
2.0000 | Freq: Every day | NASAL | Status: DC
  Administered 2019-05-08 – 2019-05-10 (×3): 2 via NASAL
  Filled 2019-05-08: qty 16

## 2019-05-08 MED ORDER — PANTOPRAZOLE SODIUM 40 MG PO TBEC
40.0000 mg | DELAYED_RELEASE_TABLET | Freq: Every day | ORAL | Status: DC
  Administered 2019-05-08 – 2019-05-10 (×3): 40 mg via ORAL
  Filled 2019-05-08 (×3): qty 1

## 2019-05-08 NOTE — Progress Notes (Signed)
PROGRESS NOTE   Michael Love  [id number]    DOB: 1956/04/23    DOA: 05/07/2019  PCP: Lawerance Cruel, MD   I have briefly reviewed patients previous medical records in [facility].  Chief Complaint  Patient presents with   Shortness of Breath    Brief Narrative:  63 year old married male, retired, independent of activities, PMH of NICM (EF 40%), childhood asthma, denies COPD, remote thyroid storm, Lyme disease, laparoscopic right hemicolectomy at [facility] in March 2019, anxiety/panic disorder, reports chronic dyspnea on exertion since hemicolectomy in March 2019, reports approximately 2 months history of worsening dyspnea, especially at nights, wakes up at times with cough and thick sputum, dyspnea/?  Orthopnea, occasional atypical chest pain, recent exposure to multiple family members with URTI symptoms and all have tested negative for COVID-19 presented with worsening dyspnea, hypoxia in the 70s requiring BiPAP, magnesium, albuterol and Solu-Medrol by EMS.  Admitted for acute respiratory failure with hypoxia, acute bronchitis, elevated troponin felt to be due to demand ischemia.  Cardiology consulted.   Assessment & Plan:   Principal Problem:   Acute respiratory failure (HCC) Active Problems:   Adenomatous polyp of ascending colon   Somatic symptom disorder   Elevated troponin   Acute respiratory failure with hypoxia/acute viral bronchitis/?  Asthma exacerbation  Infectious work-up thus far negative.  No leukocytosis.  Procalcitonin <0.10.  HIV screen negative.  RSV panel and COVID-19 testing negative.  CT chest with contrast: No acute intrathoracic abnormalities.  Bilateral lower lobe subsegmental atelectasis.  Chest x-ray on admission unremarkable.  Was in extremis requiring BiPAP when evaluated by EMS.  Lactate was elevated, 4.9 > 3.7.  Does have ongoing wheezing/clinical bronchospasm.  Continue prednisone 40 mg daily (consider changing to IV Solu-Medrol if  not improving or if worsens).  Continue bronchodilator nebulizers.  Incentive spirometry.  Although denies reflux symptoms, his nocturnal cough could be related to GERD and initiated PPI.  Currently saturating at 97% on 3 L/min oxygen, wean to maintain oxygen saturations >92%.  Chronic exertional dyspnea may be cardiac in nature but recommend outpatient pulmonology consultation for further evaluation including PFTs.  Elevated troponin  No typical chest pain.  HS troponin gradually uptrending: 108 > 372 > 426 > 584.  TTE 11/20: LVEF 35-40%, hypokinesis of entire anterior and anterolateral walls.  EF seems to be at prior baseline.  08/2017 nuclear stress test at [facility] without ischemia.  Cardiology follow-up appreciated, do not anticipate inpatient ischemic testing unless significant further uptrend.  Since then troponin has increased as noted above.  Remains on IV heparin drip, defer to cardiology regarding continuing or stopping.  NICM  TTE results as noted above.  Chest x-ray and CT chest without acute abnormalities.  Clinically does not appear overtly volume overloaded.  BNP however is elevated 612.6.  DC IV fluids.?  Consideration for a dose of Lasix.  Defer to cardiology regarding initiation of ARB.  Hypokalemia  Replaced.  Mild transaminitis  Unclear etiology.  CT abdomen showed benign-appearing abdomen and pelvis.  No GI symptoms.  Follow CMP.  S/p right hemicolectomy  Reportedly for history of adenomatous polyp  CT abdomen with contrast without acute findings.  Showed sigmoid diverticulosis.  Body mass index is 35.71 kg/m./Obesity  Anxiety/panic disorder  Continue as needed Klonopin, gabapentin and Remeron at bedtime.  DVT prophylaxis: Ongoing IV heparin infusion Code Status: DNR Family Communication: None at bedside Disposition: Patient has ongoing symptoms including dyspnea, has acute respiratory failure with hypoxia and clinical bronchospasm  requiring  further evaluation and inpatient management, troponins continue to rise for which cardiology is consulting.  He will need further inpatient care, care will cross a second midnight tonight.  Changed from observation to inpatient status.   Consultants:  Cardiology.  Procedures:  TTE 05/07/2019:  IMPRESSIONS    1. Moderately to severely reduced LV EF with hypokinesis of entire anterior and anterolateral walls. No prior images for comparison.  2. Left ventricular ejection fraction, by visual estimation, is 35 to 40%. The left ventricle has moderate to severely decreased function. There is borderline left ventricular hypertrophy.  3. Entire lateral wall and entire anterior wall are abnormal.  4. Abnormal septal motion consistent with left bundle branch block.  5. The left ventricle demonstrates regional wall motion abnormalities.  6. Global right ventricle has normal systolic function.The right ventricular size is normal. No increase in right ventricular wall thickness.  7. Left atrial size was normal.  8. Right atrial size was normal.  9. Presence of pericardial fat pad. 10. Trivial pericardial effusion is present. 11. Mild aortic valve annular calcification. 12. The mitral valve is normal in structure. Trace mitral valve regurgitation. 13. The tricuspid valve is normal in structure. Tricuspid valve regurgitation is not demonstrated. 14. The aortic valve is tricuspid. Aortic valve regurgitation is not visualized. No evidence of aortic valve sclerosis or stenosis. 15. The pulmonic valve was grossly normal. Pulmonic valve regurgitation is not visualized.   Antimicrobials:  None.   Subjective: Interviewed and examined with RN in room.  History as noted above.  Dyspnea much better but breathing not yet at baseline.  Denies heartburn/reflux symptoms.  No chest pain.  Remotely smoked a little while he was a teenager but none since.  Denies alcohol use.  Objective:  Vitals:   05/08/19  0803 05/08/19 0826 05/08/19 1101 05/08/19 1328  BP:  134/76 (!) 142/73   Pulse: 68 68 72 82  Resp: 18 20 20 18   Temp:  97.7 F (36.5 C) 98.2 F (36.8 C)   TempSrc:  Oral Oral   SpO2: 98% 98% 96% 97%  Weight:      Height:        Examination:  General exam: Pleasant middle-age male, moderately built and obese lying comfortably propped up in bed without distress. Respiratory system: Slightly harsh and diminished breath sounds bilaterally with scattered few expiratory rhonchi.  No crackles.  No increased work of breathing. Cardiovascular system: S1 & S2 heard, RRR. No JVD, murmurs, rubs, gallops or clicks. No pedal edema.  Telemetry personally reviewed: Sinus rhythm with BBB morphology. Gastrointestinal system: Abdomen is nondistended, soft and nontender. No organomegaly or masses felt. Normal bowel sounds heard. Central nervous system: Alert and oriented. No focal neurological deficits. Extremities: Symmetric 5 x 5 power. Skin: No rashes, lesions or ulcers Psychiatry: Judgement and insight appear normal. Mood & affect flat.     Data Reviewed: I have personally reviewed following labs and imaging studies   CBC: Recent Labs  Lab 05/07/19 0819 05/07/19 0830 05/08/19 0208  WBC 9.5  --  10.7*  HGB 15.2 15.6 14.1  HCT 46.3 46.0 42.5  MCV 92.8  --  90.8  PLT 264  --  123456    Basic Metabolic Panel: Recent Labs  Lab 05/07/19 0819 05/07/19 0830 05/08/19 0208  NA 137 139 139  K 3.4* 3.6 4.3  CL 103  --  105  CO2 22  --  24  GLUCOSE 139*  --  142*  BUN 6*  --  6*  CREATININE 1.05  --  1.01  CALCIUM 8.8*  --  9.0    Liver Function Tests: Recent Labs  Lab 05/07/19 0819  AST 60*  ALT 54*  ALKPHOS 60  BILITOT 0.5  PROT 6.9  ALBUMIN 3.9    CBG: No results for input(s): GLUCAP in the last 168 hours.  Recent Results (from the past 240 hour(s))  Blood Culture x 1     Status: None (Preliminary result)   Collection Time: 05/07/19  8:19 AM   Specimen: BLOOD LEFT HAND    Result Value Ref Range Status   Specimen Description BLOOD LEFT HAND  Final   Special Requests   Final    BOTTLES DRAWN AEROBIC AND ANAEROBIC Blood Culture adequate volume   Culture   Final    NO GROWTH 1 DAY Performed at [facility], [address], [place], [place] [address]    Report Status PENDING  Incomplete  SARS CORONAVIRUS 2 (TAT 6-24 HRS) Nasopharyngeal Nasopharyngeal Swab     Status: None   Collection Time: 05/07/19  9:34 AM   Specimen: Nasopharyngeal Swab  Result Value Ref Range Status   SARS Coronavirus 2 NEGATIVE NEGATIVE Final    Comment: (NOTE) SARS-CoV-2 target nucleic acids are NOT DETECTED. The SARS-CoV-2 RNA is generally detectable in upper and lower respiratory specimens during the acute phase of infection. Negative results do not preclude SARS-CoV-2 infection, do not rule out co-infections with other pathogens, and should not be used as the sole basis for treatment or other patient management decisions. Negative results must be combined with clinical observations, patient history, and epidemiological information. The expected result is Negative. Fact Sheet for Patients: SugarRoll.be Fact Sheet for Healthcare Providers: https://www.woods-mathews.com/ This test is not yet approved or cleared by the [place] FDA and  has been authorized for detection and/or diagnosis of SARS-CoV-2 by FDA under an Emergency Use Authorization (EUA). This EUA will remain  in effect (meaning this test can be used) for the duration of the COVID-19 declaration under Section 56 4(b)(1) of the Act, 21 U.S.C. section 360bbb-3(b)(1), unless the authorization is terminated or revoked sooner. Performed at [facility], [place] [address], [place], [place] [address]   Respiratory Panel by PCR     Status: None   Collection Time: 05/07/19 11:55 AM   Specimen: Nasopharyngeal Swab; Respiratory  Result Value Ref Range Status    Adenovirus NOT DETECTED NOT DETECTED Final   Coronavirus 229E NOT DETECTED NOT DETECTED Final    Comment: (NOTE) The Coronavirus on the Respiratory Panel, DOES NOT test for the novel  Coronavirus (2019 nCoV)    Coronavirus HKU1 NOT DETECTED NOT DETECTED Final   Coronavirus NL63 NOT DETECTED NOT DETECTED Final   Coronavirus OC43 NOT DETECTED NOT DETECTED Final   Metapneumovirus NOT DETECTED NOT DETECTED Final   Rhinovirus / Enterovirus NOT DETECTED NOT DETECTED Final   Influenza A NOT DETECTED NOT DETECTED Final   Influenza B NOT DETECTED NOT DETECTED Final   Parainfluenza Virus 1 NOT DETECTED NOT DETECTED Final   Parainfluenza Virus 2 NOT DETECTED NOT DETECTED Final   Parainfluenza Virus 3 NOT DETECTED NOT DETECTED Final   Parainfluenza Virus 4 NOT DETECTED NOT DETECTED Final   Respiratory Syncytial Virus NOT DETECTED NOT DETECTED Final   Bordetella pertussis NOT DETECTED NOT DETECTED Final   Chlamydophila pneumoniae NOT DETECTED NOT DETECTED Final   Mycoplasma pneumoniae NOT DETECTED NOT DETECTED Final    Comment: Performed at [facility], [place] [address], [place], [place] [address]      Radiology Studies: Ct Chest W Contrast  Result Date: 05/07/2019 CLINICAL DATA:  Shortness of breath, fever, acute respiratory illness, persistent cough EXAM: CT CHEST WITH CONTRAST TECHNIQUE: Multidetector CT imaging of the chest was performed during intravenous contrast administration. Sagittal and coronal MPR images reconstructed from axial data set. CONTRAST:  75mL OMNIPAQUE IOHEXOL 300 MG/ML  SOLN IV. COMPARISON:  None FINDINGS: Cardiovascular: Aorta normal caliber without aneurysm or dissection on non dedicated exam. Mild atherosclerotic calcifications at the coronary arteries. Heart unremarkable. No pericardial effusion. Mediastinum/Nodes: Base of cervical region normal appearance. Esophagus normal appearance. Scattered normal sized mediastinal lymph nodes. No thoracic adenopathy.  Lungs/Pleura: Minimal subsegmental atelectasis in lower lobes. Lungs otherwise clear. No pulmonary infiltrate, pleural effusion, pneumothorax or mass. Upper Abdomen: Probable fatty infiltration of liver. Remaining visualized upper abdomen unremarkable. Musculoskeletal: No acute osseous findings. IMPRESSION: No acute intrathoracic abnormalities. Minimal coronary arterial calcification and subsegmental atelectasis in lower lobes. Electronically Signed   By: Lavonia Dana M.D.   On: 05/07/2019 14:46   Ct Abdomen Pelvis W Contrast  Result Date: 05/07/2019 CLINICAL DATA:  Abdominal distention. EXAM: CT ABDOMEN AND PELVIS WITH CONTRAST TECHNIQUE: Multidetector CT imaging of the abdomen and pelvis was performed using the standard protocol following bolus administration of intravenous contrast. CONTRAST:  118mL OMNIPAQUE IOHEXOL 300 MG/ML  SOLN COMPARISON:  CT scan dated 07/15/2017 FINDINGS: Lower chest: Normal. Hepatobiliary: No focal liver abnormality is seen. No gallstones, gallbladder wall thickening, or biliary dilatation. Pancreas: Unremarkable. No pancreatic ductal dilatation or surrounding inflammatory changes. Spleen: Normal in size without focal abnormality. Adrenals/Urinary Tract: 10 mm simple cyst on the upper pole of the right kidney. Kidneys, ureters, bladder, and adrenal glands are otherwise normal. Stomach/Bowel: The patient appears to have had resection of cecum. The stomach and small bowel are normal. Numerous diverticula in the sigmoid portion of the colon. No diverticulitis. Vascular/Lymphatic: Aortic atherosclerosis. No adenopathy. Reproductive: Prostate is unremarkable. Other: No abdominal wall hernia or abnormality. No abdominopelvic ascites. Musculoskeletal: No acute or significant osseous findings. IMPRESSION: 1. Benign-appearing abdomen and pelvis. 2. Diverticulosis of the sigmoid portion of the colon. 3. Aortic Atherosclerosis (ICD10-I70.0). Electronically Signed   By: Lorriane Shire M.D.   On:  05/07/2019 13:08   Xr Chest Single View  Result Date: 05/07/2019 CLINICAL DATA:  Shortness of breath EXAM: PORTABLE CHEST 1 VIEW COMPARISON:  August 12, 2017 FINDINGS: There is no edema or consolidation. Heart is upper normal in size with pulmonary vascularity normal. No adenopathy. There is degenerative change in the thoracic spine. IMPRESSION: No edema or consolidation.  Heart upper normal in size. Electronically Signed   By: Lowella Grip III M.D.   On: 05/07/2019 08:38          Scheduled Meds:  HYDROcodone-acetaminophen  1 tablet Oral BID   ipratropium-albuterol  3 mL Nebulization Q6H   predniSONE  40 mg Oral Q breakfast   sodium chloride flush  3 mL Intravenous Q12H   Continuous Infusions:  heparin 1,250 Units/hr (05/07/19 1931)   lactated ringers 75 mL/hr at 05/08/19 1034     LOS: 0 days     Vernell Leep, MD, [place], [facility]. [facility]  To contact the attending provider between 7A-7P or the covering provider during after hours 7P-7A, please log into the web site [email or web address] and access using universal [place] password for that web site. If you do not have the password, please call the hospital operator.  05/08/2019, 2:21 PM

## 2019-05-08 NOTE — Progress Notes (Signed)
Nutrition Brief Note  RD received consult for nutrition assessment   63 year old married male, retired, independent of activities, PMH of NICM (EF 40%), childhood asthma, denies COPD, remote thyroid storm, Lyme disease, laparoscopic right hemicolectomy at [facility] in March 2019, anxiety/panic disorder, reports chronic dyspnea on exertion since hemicolectomy in March 2019, reports approximately 2 months history of worsening dyspnea  Pt reports good appetite and oral intake; pt currently eating 100% of meals. Per chart, pt is weight stable.   Wt Readings from Last 15 Encounters:  05/08/19 119.4 kg  02/19/18 108.9 kg  08/18/17 112.5 kg  08/12/17 112.5 kg  08/12/17 112.5 kg  06/07/11 112.8 kg  05/20/11 108.9 kg    Body mass index is 35.71 kg/m. Patient meets criteria for obesity based on current BMI.   Current diet order is heart healthy, patient is consuming approximately 100% of meals at this time. Labs and medications reviewed.   No nutrition interventions warranted at this time. If nutrition issues arise, please consult RD.   Koleen Distance MS, RD, LDN Pager #- [phone] Office#- [phone] After Hours Pager: [phone]

## 2019-05-08 NOTE — Progress Notes (Signed)
Progress Note  Patient Name: Michael Love Date of Encounter: 05/08/2019  Primary Cardiologist: Lujean Amel ([facility])  Subjective   SOB much improved but still ongoing significant symptoms.   Inpatient Medications    Scheduled Meds: . HYDROcodone-acetaminophen  1 tablet Oral BID  . ipratropium-albuterol  3 mL Nebulization Q6H  . predniSONE  40 mg Oral Q breakfast  . sodium chloride flush  3 mL Intravenous Q12H   Continuous Infusions: . heparin 1,250 Units/hr (05/07/19 1931)  . lactated ringers 75 mL/hr at 05/07/19 1549   PRN Meds: albuterol, clonazePAM, morphine injection   Vital Signs    Vitals:   05/08/19 0257 05/08/19 0327 05/08/19 0803 05/08/19 0826  BP:  117/66  134/76  Pulse:  66 68 68  Resp:  19 18 20   Temp:  (!) 97.4 F (36.3 C)  97.7 F (36.5 C)  TempSrc:  Oral  Oral  SpO2: 99% 99% 98% 98%  Weight:  119.4 kg    Height:        Intake/Output Summary (Last 24 hours) at 05/08/2019 1023 Last data filed at 05/08/2019 0851 Gross per 24 hour  Intake 796.64 ml  Output 1400 ml  Net -603.36 ml   Last 3 Weights 05/08/2019 05/07/2019 02/19/2018  Weight (lbs) 263 lb 4.8 oz 260 lb 240 lb  Weight (kg) 119.432 kg 117.935 kg 108.863 kg      Telemetry    SR - Personally Reviewed  ECG    n/a - Personally Reviewed  Physical Exam   GEN: No acute distress.   Neck: No JVD Cardiac: RRR, no murmurs, rubs, or gallops.  Respiratory: bilateral wheezing GI: Soft, nontender, non-distended  MS: No edema; No deformity. Neuro:  Nonfocal  Psych: Normal affect   Labs    High Sensitivity Troponin:   Recent Labs  Lab 05/07/19 0819 05/07/19 0959 05/07/19 1155  TROPONINIHS 108* 372* 426*      Chemistry Recent Labs  Lab 05/07/19 0819 05/07/19 0830 05/08/19 0208  NA 137 139 139  K 3.4* 3.6 4.3  CL 103  --  105  CO2 22  --  24  GLUCOSE 139*  --  142*  BUN 6*  --  6*  CREATININE 1.05  --  1.01  CALCIUM 8.8*  --  9.0  PROT 6.9  --   --    ALBUMIN 3.9  --   --   AST 60*  --   --   ALT 54*  --   --   ALKPHOS 60  --   --   BILITOT 0.5  --   --   GFRNONAA >60  --  >60  GFRAA >60  --  >60  ANIONGAP 12  --  10     Hematology Recent Labs  Lab 05/07/19 0819 05/07/19 0830 05/08/19 0208  WBC 9.5  --  10.7*  RBC 4.99  --  4.68  HGB 15.2 15.6 14.1  HCT 46.3 46.0 42.5  MCV 92.8  --  90.8  MCH 30.5  --  30.1  MCHC 32.8  --  33.2  RDW 13.6  --  13.7  PLT 264  --  280    BNP Recent Labs  Lab 05/07/19 0819  BNP 31.9     DDimer No results for input(s): DDIMER in the last 168 hours.   Radiology    Ct Chest W Contrast  Result Date: 05/07/2019 CLINICAL DATA:  Shortness of breath, fever, acute respiratory illness, persistent cough EXAM: CT CHEST  WITH CONTRAST TECHNIQUE: Multidetector CT imaging of the chest was performed during intravenous contrast administration. Sagittal and coronal MPR images reconstructed from axial data set. CONTRAST:  68mL OMNIPAQUE IOHEXOL 300 MG/ML  SOLN IV. COMPARISON:  None FINDINGS: Cardiovascular: Aorta normal caliber without aneurysm or dissection on non dedicated exam. Mild atherosclerotic calcifications at the coronary arteries. Heart unremarkable. No pericardial effusion. Mediastinum/Nodes: Base of cervical region normal appearance. Esophagus normal appearance. Scattered normal sized mediastinal lymph nodes. No thoracic adenopathy. Lungs/Pleura: Minimal subsegmental atelectasis in lower lobes. Lungs otherwise clear. No pulmonary infiltrate, pleural effusion, pneumothorax or mass. Upper Abdomen: Probable fatty infiltration of liver. Remaining visualized upper abdomen unremarkable. Musculoskeletal: No acute osseous findings. IMPRESSION: No acute intrathoracic abnormalities. Minimal coronary arterial calcification and subsegmental atelectasis in lower lobes. Electronically Signed   By: Lavonia Dana M.D.   On: 05/07/2019 14:46   Ct Abdomen Pelvis W Contrast  Result Date: 05/07/2019 CLINICAL DATA:   Abdominal distention. EXAM: CT ABDOMEN AND PELVIS WITH CONTRAST TECHNIQUE: Multidetector CT imaging of the abdomen and pelvis was performed using the standard protocol following bolus administration of intravenous contrast. CONTRAST:  123mL OMNIPAQUE IOHEXOL 300 MG/ML  SOLN COMPARISON:  CT scan dated 07/15/2017 FINDINGS: Lower chest: Normal. Hepatobiliary: No focal liver abnormality is seen. No gallstones, gallbladder wall thickening, or biliary dilatation. Pancreas: Unremarkable. No pancreatic ductal dilatation or surrounding inflammatory changes. Spleen: Normal in size without focal abnormality. Adrenals/Urinary Tract: 10 mm simple cyst on the upper pole of the right kidney. Kidneys, ureters, bladder, and adrenal glands are otherwise normal. Stomach/Bowel: The patient appears to have had resection of cecum. The stomach and small bowel are normal. Numerous diverticula in the sigmoid portion of the colon. No diverticulitis. Vascular/Lymphatic: Aortic atherosclerosis. No adenopathy. Reproductive: Prostate is unremarkable. Other: No abdominal wall hernia or abnormality. No abdominopelvic ascites. Musculoskeletal: No acute or significant osseous findings. IMPRESSION: 1. Benign-appearing abdomen and pelvis. 2. Diverticulosis of the sigmoid portion of the colon. 3. Aortic Atherosclerosis (ICD10-I70.0). Electronically Signed   By: Lorriane Shire M.D.   On: 05/07/2019 13:08   Xr Chest Single View  Result Date: 05/07/2019 CLINICAL DATA:  Shortness of breath EXAM: PORTABLE CHEST 1 VIEW COMPARISON:  August 12, 2017 FINDINGS: There is no edema or consolidation. Heart is upper normal in size with pulmonary vascularity normal. No adenopathy. There is degenerative change in the thoracic spine. IMPRESSION: No edema or consolidation.  Heart upper normal in size. Electronically Signed   By: Lowella Grip III M.D.   On: 05/07/2019 08:38    Cardiac Studies     Patient Profile     Michael Love is a 63 y.o.  male with a hx of COPD, CHF( NICM, EF 40%), HLD, and lyme disease who is being seen today for the evaluation of elevated troponin at the request of Dr. Sedonia Small.  Assessment & Plan    1. Elevated troponin - up to 426 without a clear peak, will repeat this AM - admitted with hypoxia into the 70s, placed on bipap initially - chest pain was was atypical. Reports cold like symptoms for several weeks with productive cough/SOB/wheezing, several family members affected. COVID neg -04/2019 echo LVEF 35-40% (his baseline LVEF based on [facility] 2019 echo), lateral and anterior hypokinesis.  - 08/2017 nuclear stress [facility]: no ischemia  - no plans for ischemic testing today. F/u repeat troponin, I would not anticipate inpatient ischemic testing unless significant further uptrend. I suspect demand ischemia in setting of respiraotry distress and severe hypoxia on admission.  2. COPD exacerbation.  - admitted with hypoxia into the 70s, placed on bipap initially - presented with SOB, productive cough.  - COVID neg  3. NICM - -04/2019 echo LVEF 35-40% (his baseline LVEF based on [facility] 2019 echo), lateral and anterior hypokinesis.  - 08/2017 nuclear stress [facility]: no ischemia  - he does not appear volume overloaded on exam. CXR without edema or consolidation, CT chest without acute process. BNP pending. At this time I think primary issues are pulmonary.    4. History of lyme disease    For questions or updates, please contact [place] Please consult [email or web address] for contact info under        Signed, Carlyle Dolly, MD  05/08/2019, 10:23 AM

## 2019-05-08 NOTE — Progress Notes (Signed)
Patient walked from his room to the nurses station. He started on 3L O2 at 99 in the room, as he progress towards the nurses station, 98 O2 at 2L and at the nurses station 96 O2 at [place]. Patient walked back and sat on the edge of the bed with 95 O2 on room air. Patient did not complain of any shortness of breathe during the entire walk.

## 2019-05-08 NOTE — Progress Notes (Signed)
[place] for Heparin Indication: chest pain/ACS/NSTEMI  No Known Allergies  Patient Measurements: Height: 6' (182.9 cm) Weight: 263 lb 4.8 oz (119.4 kg) IBW/kg (Calculated) : 77.6 Heparin Dosing Weight: 103.3 kg   Vital Signs: Temp: 98.2 F (36.8 C) (11/21 1101) Temp Source: Oral (11/21 1101) BP: 142/73 (11/21 1101) Pulse Rate: 82 (11/21 1328)  Labs: Recent Labs    05/07/19 0819 05/07/19 0830 05/07/19 0959 05/07/19 1155 05/07/19 2052 05/08/19 0208 05/08/19 1042 05/08/19 1449  HGB 15.2 15.6  --   --   --  14.1  --   --   HCT 46.3 46.0  --   --   --  42.5  --   --   PLT 264  --   --   --   --  280  --   --   APTT  --   --   --   --  35  --   --   --   LABPROT  --   --   --   --  13.7  --   --   --   INR  --   --   --   --  1.1  --   --   --   HEPARINUNFRC  --   --   --   --   --  0.49  --  0.30  CREATININE 1.05  --   --   --   --  1.01  --   --   TROPONINIHS 108*  --  372* 426*  --   --  584*  --     Estimated Creatinine Clearance: 98.6 mL/min (by C-G formula based on SCr of 1.01 mg/dL).  Assessment: 63 y.o. male admitted with SOB/chest pain and elevated cardiac markers for heparin  Heparin level down to 0.3  Goal of Therapy:  Heparin level 0.3-0.7 units/ml Monitor platelets by anticoagulation protocol: Yes   Plan:  Increase heparin to 1250 units / hr Follow up AM labs  Thank you Anette Guarneri, PharmD   05/08/2019,3:47 PM

## 2019-05-08 NOTE — Plan of Care (Signed)
  Problem: Education: Goal: Knowledge of General Education information will improve Description: Including pain rating scale, medication(s)/side effects and non-pharmacologic comfort measures Outcome: Progressing   Problem: Health Behavior/Discharge Planning: Goal: Ability to manage health-related needs will improve Outcome: Progressing   Problem: Clinical Measurements: Goal: Ability to maintain clinical measurements within normal limits will improve Outcome: Progressing Goal: Will remain free from infection Outcome: Progressing Goal: Diagnostic test results will improve Outcome: Progressing Goal: Respiratory complications will improve Outcome: Progressing Goal: Cardiovascular complication will be avoided Outcome: Progressing   Problem: Activity: Goal: Risk for activity intolerance will decrease Outcome: Progressing   Problem: Nutrition: Goal: Adequate nutrition will be maintained Outcome: Progressing   Problem: Coping: Goal: Level of anxiety will decrease Outcome: Progressing   Problem: Elimination: Goal: Will not experience complications related to bowel motility Outcome: Progressing Goal: Will not experience complications related to urinary retention Outcome: Progressing   Problem: Pain Managment: Goal: General experience of comfort will improve Description: Patient has chronic back pain due to a fall months ago. MD notified and gave 2mg  IV morphine and has hydrocodone scheduled for 2200. Outcome: Progressing   Problem: Safety: Goal: Ability to remain free from injury will improve Outcome: Progressing   Problem: Skin Integrity: Goal: Risk for impaired skin integrity will decrease Outcome: Progressing   Problem: Education: Goal: Ability to demonstrate management of disease process will improve Outcome: Progressing Goal: Ability to verbalize understanding of medication therapies will improve Outcome: Progressing Goal: Individualized Educational  Video(s) Outcome: Progressing   Problem: Activity: Goal: Capacity to carry out activities will improve Outcome: Progressing   Problem: Cardiac: Goal: Ability to achieve and maintain adequate cardiopulmonary perfusion will improve Outcome: Progressing

## 2019-05-08 NOTE — Progress Notes (Signed)
[place] for Heparin Indication: chest pain/ACS/NSTEMI  No Known Allergies  Patient Measurements: Height: 6' (182.9 cm) Weight: 260 lb (117.9 kg) IBW/kg (Calculated) : 77.6 Heparin Dosing Weight: 103.3 kg   Vital Signs: Temp: 98.5 F (36.9 C) (11/21 0032) Temp Source: Oral (11/21 0032) BP: 121/75 (11/21 0032) Pulse Rate: 82 (11/21 0032)  Labs: Recent Labs    05/07/19 0819 05/07/19 0830 05/07/19 0959 05/07/19 1155 05/07/19 2052 05/08/19 0208  HGB 15.2 15.6  --   --   --  14.1  HCT 46.3 46.0  --   --   --  42.5  PLT 264  --   --   --   --  280  APTT  --   --   --   --  35  --   LABPROT  --   --   --   --  13.7  --   INR  --   --   --   --  1.1  --   HEPARINUNFRC  --   --   --   --   --  0.49  CREATININE 1.05  --   --   --   --  1.01  TROPONINIHS 108*  --  372* 426*  --   --     Estimated Creatinine Clearance: 97.9 mL/min (by C-G formula based on SCr of 1.01 mg/dL).  Assessment: 63 y.o. male admitted with SOB/chest pain and elevated cardiac markers for heparin  Goal of Therapy:  Heparin level 0.3-0.7 units/ml Monitor platelets by anticoagulation protocol: Yes   Plan:  Continue Heparin at current rate   Phillis Knack, PharmD, BCPS  05/08/2019,2:47 AM

## 2019-05-09 DIAGNOSIS — I5023 Acute on chronic systolic (congestive) heart failure: Secondary | ICD-10-CM

## 2019-05-09 LAB — CBC
HCT: 44.4 % (ref 39.0–52.0)
Hemoglobin: 14.4 g/dL (ref 13.0–17.0)
MCH: 30.3 pg (ref 26.0–34.0)
MCHC: 32.4 g/dL (ref 30.0–36.0)
MCV: 93.3 fL (ref 80.0–100.0)
Platelets: 283 10*3/uL (ref 150–400)
RBC: 4.76 MIL/uL (ref 4.22–5.81)
RDW: 14.1 % (ref 11.5–15.5)
WBC: 16.7 10*3/uL — ABNORMAL HIGH (ref 4.0–10.5)
nRBC: 0 % (ref 0.0–0.2)

## 2019-05-09 LAB — HEPARIN LEVEL (UNFRACTIONATED): Heparin Unfractionated: 0.33 IU/mL (ref 0.30–0.70)

## 2019-05-09 LAB — TROPONIN I (HIGH SENSITIVITY): Troponin I (High Sensitivity): 437 ng/L (ref <18)

## 2019-05-09 MED ORDER — ENOXAPARIN SODIUM 40 MG/0.4ML ~~LOC~~ SOLN
40.0000 mg | SUBCUTANEOUS | Status: DC
  Administered 2019-05-09: 40 mg via SUBCUTANEOUS
  Filled 2019-05-09: qty 0.4

## 2019-05-09 MED ORDER — FUROSEMIDE 10 MG/ML IJ SOLN
40.0000 mg | Freq: Once | INTRAMUSCULAR | Status: AC
  Administered 2019-05-09: 40 mg via INTRAVENOUS
  Filled 2019-05-09: qty 4

## 2019-05-09 NOTE — Progress Notes (Signed)
   Vital Signs MEWS/VS Documentation      05/09/2019 1314 05/09/2019 1459 05/09/2019 1509 05/09/2019 1629   MEWS Score:  0  0  2  0   MEWS Score Color:  Green  Green  Yellow  Green   Resp:  20  14  (!) 21  16   Pulse:  74  -  -  73   BP:  -  -  -  130/82   O2 Device:  Room Air  -  -  Room Air   FiO2 (%):  21 %  -  -  -      Patient slightly anxious. Anti-anxiety medication given, respirations and heart rate within defined limits. Current vitals are as follows:     05/09/19 1629  Vitals  BP 130/82  MAP (mmHg) 97  BP Location Right Arm  BP Method Automatic  Patient Position (if appropriate) Lying  Pulse Rate 73  Resp 16  Oxygen Therapy  O2 Device Room Air  MEWS Score  MEWS RR 0  MEWS Pulse 0  MEWS Systolic 0  MEWS LOC 0  MEWS Temp 0  MEWS Score 0  MEWS Score Color Green   Patient currently lying in bed, call bell within reach, spouse at bedside.     [place] 05/09/2019,4:32 PM

## 2019-05-09 NOTE — Progress Notes (Addendum)
PROGRESS NOTE   Michael SCHOOF  [id number]    DOB: 1955/08/14    DOA: 05/07/2019  PCP: Lawerance Cruel, MD   I have briefly reviewed patients previous medical records in [facility]  Chief Complaint  Patient presents with   Shortness of Breath    Brief Narrative:  63 year old married male, retired, independent of activities, PMH of NICM (EF 40%), childhood asthma, denies COPD, remote thyroid storm, Lyme disease, laparoscopic right hemicolectomy at [facility] in March 2019, anxiety/panic disorder, reports chronic dyspnea on exertion since hemicolectomy in March 2019, reports approximately 2 months history of worsening dyspnea, especially at nights, wakes up at times with cough and thick sputum, dyspnea/?  Orthopnea, occasional atypical chest pain, recent exposure to multiple family members with URTI symptoms and all have tested negative for COVID-19 presented with worsening dyspnea, hypoxia in the 70s requiring BiPAP, magnesium, albuterol and Solu-Medrol by EMS.  Admitted for acute respiratory failure with hypoxia, acute bronchitis, elevated troponin felt to be due to demand ischemia.  Cardiology consulted.  Improving.   Assessment & Plan:   Principal Problem:   Acute respiratory failure (HCC) Active Problems:   Adenomatous polyp of ascending colon   Somatic symptom disorder   Elevated troponin   Acute respiratory failure with hypoxia (HCC)   Acute bronchitis   Acute respiratory failure with hypoxia/acute viral bronchitis/?  Asthma exacerbation  Infectious work-up thus far negative.  No leukocytosis.  Procalcitonin <0.10.  HIV screen negative.  RSV panel and COVID-19 testing negative.  CT chest with contrast: No acute intrathoracic abnormalities.  Bilateral lower lobe subsegmental atelectasis.  Chest x-ray on admission unremarkable.  Was in extremis requiring BiPAP when evaluated by EMS.  Lactate was elevated, 4.9 > 3.7.  Continue prednisone 40 mg daily (consider  changing to IV Solu-Medrol if not improving or if worsens).  Continue bronchodilator nebulizers.  Incentive spirometry.  Although denies reflux symptoms, his nocturnal cough could be related to GERD and initiated PPI.  Chronic exertional dyspnea may be cardiac in nature but recommend outpatient pulmonology consultation for further evaluation including PFTs.  Overall much better.  Hypoxia at rest seems to have resolved.  Will need to reassess ambulatory O2 needs prior to discharge.  Still has some wheezing.  Continue current regimen.  Elevated troponin  No typical chest pain.  HS troponin gradually uptrending: 108 > 372 > 426 > 584.  Now starting to downtrend.  TTE 11/20: LVEF 35-40%, hypokinesis of entire anterior and anterolateral walls.  EF seems to be at prior baseline.  08/2017 nuclear stress test at [facility] without ischemia.  Cardiology follow-up appreciated, discussed with Dr. Harl Bowie.  Discontinued IV heparin infusion and switched to Lovenox DVT prophylactic dose.  No further ischemic work-up plan.  NICM/acute on chronic systolic CHF  TTE results as noted above.  Chest x-ray and CT chest without acute abnormalities.  Clinically does not appear overtly volume overloaded but volume status difficult to assess due to body habitus.  BNP however is elevated 612.6.  S/p IV Lasix 40 mg x 1 yesterday, -1.9 L, cardiology repeated additional IV Lasix 40 mg x 1 today and recommend monitoring overnight and consider discharging tomorrow on oral Lasix 40 mg.  Follow BMP in a.m.  Follow-up with [place] cardiology at discharge.  Hypokalemia  Replaced.  Follow BMP in a.m.  Mild transaminitis  Unclear etiology.  CT abdomen showed benign-appearing abdomen and pelvis.  No GI symptoms.  Follow CMP in a.m.  S/p right hemicolectomy  Reportedly  for history of adenomatous polyp  CT abdomen with contrast without acute findings.  Showed sigmoid diverticulosis.  FOBT negative.  Body mass index is  35.3 kg/m./Obesity  Anxiety/panic disorder  Continue as needed Klonopin.  Patient not on gabapentin or Remeron.  DVT prophylaxis: Ongoing IV heparin infusion Code Status: DNR Family Communication: None at bedside.  I called and updated patient's spouse, updated care and answered questions. Disposition: Continues to clinically improve.  Diuresing with IV Lasix today, monitor closely overnight and reassess in a.m. with repeat labs and hopeful discharge home.   Consultants:  Cardiology.  Procedures:  TTE 05/07/2019:  IMPRESSIONS    1. Moderately to severely reduced LV EF with hypokinesis of entire anterior and anterolateral walls. No prior images for comparison.  2. Left ventricular ejection fraction, by visual estimation, is 35 to 40%. The left ventricle has moderate to severely decreased function. There is borderline left ventricular hypertrophy.  3. Entire lateral wall and entire anterior wall are abnormal.  4. Abnormal septal motion consistent with left bundle branch block.  5. The left ventricle demonstrates regional wall motion abnormalities.  6. Global right ventricle has normal systolic function.The right ventricular size is normal. No increase in right ventricular wall thickness.  7. Left atrial size was normal.  8. Right atrial size was normal.  9. Presence of pericardial fat pad. 10. Trivial pericardial effusion is present. 11. Mild aortic valve annular calcification. 12. The mitral valve is normal in structure. Trace mitral valve regurgitation. 13. The tricuspid valve is normal in structure. Tricuspid valve regurgitation is not demonstrated. 14. The aortic valve is tricuspid. Aortic valve regurgitation is not visualized. No evidence of aortic valve sclerosis or stenosis. 15. The pulmonic valve was grossly normal. Pulmonic valve regurgitation is not visualized.   Antimicrobials:  None.   Subjective: Reports dyspnea has significantly improved.  However breathing  not normal, has had chronic dyspnea for a long time.  Able to sleep better last night.  No chest pain or cough.  Objective:  Vitals:   05/09/19 0716 05/09/19 0758 05/09/19 1106 05/09/19 1314  BP:  135/83 (!) 161/91   Pulse:  62 70 74  Resp:  (!) 22 16 20   Temp:  98.1 F (36.7 C) 98.3 F (36.8 C)   TempSrc:  Oral Oral   SpO2: 97% 93% 95% 94%  Weight:      Height:        Examination:  General exam: Pleasant middle-age male, moderately built and obese lying comfortably propped up in bed without distress. Respiratory system: Much improved breath sounds.  Few anterior rhonchi, much less compared to yesterday.  No crackles.  No increased work of breathing. Cardiovascular system: S1 and S2 heard, RRR.  JVD +.  No murmurs.  No ankle edema.  Telemetry personally reviewed: Sinus rhythm with BBB morphology. Gastrointestinal system: Abdomen is nondistended, soft and nontender. No organomegaly or masses felt. Normal bowel sounds heard. Central nervous system: Alert and oriented. No focal neurological deficits. Extremities: Symmetric 5 x 5 power. Skin: No rashes, lesions or ulcers Psychiatry: Judgement and insight appear normal. Mood & affect flat.     Data Reviewed: I have personally reviewed following labs and imaging studies   CBC: Recent Labs  Lab 05/07/19 0819 05/07/19 0830 05/08/19 0208 05/09/19 0342  WBC 9.5  --  10.7* 16.7*  HGB 15.2 15.6 14.1 14.4  HCT 46.3 46.0 42.5 44.4  MCV 92.8  --  90.8 93.3  PLT 264  --  280 283  Basic Metabolic Panel: Recent Labs  Lab 05/07/19 0819 05/07/19 0830 05/08/19 0208  NA 137 139 139  K 3.4* 3.6 4.3  CL 103  --  105  CO2 22  --  24  GLUCOSE 139*  --  142*  BUN 6*  --  6*  CREATININE 1.05  --  1.01  CALCIUM 8.8*  --  9.0    Liver Function Tests: Recent Labs  Lab 05/07/19 0819  AST 60*  ALT 54*  ALKPHOS 60  BILITOT 0.5  PROT 6.9  ALBUMIN 3.9    CBG: No results for input(s): GLUCAP in the last 168 hours.  Recent  Results (from the past 240 hour(s))  Blood Culture x 1     Status: None (Preliminary result)   Collection Time: 05/07/19  8:19 AM   Specimen: BLOOD LEFT HAND  Result Value Ref Range Status   Specimen Description BLOOD LEFT HAND  Final   Special Requests   Final    BOTTLES DRAWN AEROBIC AND ANAEROBIC Blood Culture adequate volume   Culture   Final    NO GROWTH 2 DAYS Performed at [facility], [address], [place], [place] [address]    Report Status PENDING  Incomplete  SARS CORONAVIRUS 2 (TAT 6-24 HRS) Nasopharyngeal Nasopharyngeal Swab     Status: None   Collection Time: 05/07/19  9:34 AM   Specimen: Nasopharyngeal Swab  Result Value Ref Range Status   SARS Coronavirus 2 NEGATIVE NEGATIVE Final    Comment: (NOTE) SARS-CoV-2 target nucleic acids are NOT DETECTED. The SARS-CoV-2 RNA is generally detectable in upper and lower respiratory specimens during the acute phase of infection. Negative results do not preclude SARS-CoV-2 infection, do not rule out co-infections with other pathogens, and should not be used as the sole basis for treatment or other patient management decisions. Negative results must be combined with clinical observations, patient history, and epidemiological information. The expected result is Negative. Fact Sheet for Patients: SugarRoll.be Fact Sheet for Healthcare Providers: https://www.woods-mathews.com/ This test is not yet approved or cleared by the [place] FDA and  has been authorized for detection and/or diagnosis of SARS-CoV-2 by FDA under an Emergency Use Authorization (EUA). This EUA will remain  in effect (meaning this test can be used) for the duration of the COVID-19 declaration under Section 56 4(b)(1) of the Act, 21 U.S.C. section 360bbb-3(b)(1), unless the authorization is terminated or revoked sooner. Performed at [facility], [place] [address] [place], [place] [address]     Respiratory Panel by PCR     Status: None   Collection Time: 05/07/19 11:55 AM   Specimen: Nasopharyngeal Swab; Respiratory  Result Value Ref Range Status   Adenovirus NOT DETECTED NOT DETECTED Final   Coronavirus 229E NOT DETECTED NOT DETECTED Final    Comment: (NOTE) The Coronavirus on the Respiratory Panel, DOES NOT test for the novel  Coronavirus (2019 nCoV)    Coronavirus HKU1 NOT DETECTED NOT DETECTED Final   Coronavirus NL63 NOT DETECTED NOT DETECTED Final   Coronavirus OC43 NOT DETECTED NOT DETECTED Final   Metapneumovirus NOT DETECTED NOT DETECTED Final   Rhinovirus / Enterovirus NOT DETECTED NOT DETECTED Final   Influenza A NOT DETECTED NOT DETECTED Final   Influenza B NOT DETECTED NOT DETECTED Final   Parainfluenza Virus 1 NOT DETECTED NOT DETECTED Final   Parainfluenza Virus 2 NOT DETECTED NOT DETECTED Final   Parainfluenza Virus 3 NOT DETECTED NOT DETECTED Final   Parainfluenza Virus 4  NOT DETECTED NOT DETECTED Final   Respiratory Syncytial Virus NOT DETECTED NOT DETECTED Final   Bordetella pertussis NOT DETECTED NOT DETECTED Final   Chlamydophila pneumoniae NOT DETECTED NOT DETECTED Final   Mycoplasma pneumoniae NOT DETECTED NOT DETECTED Final    Comment: Performed at [facility], [place] [address], [place], [place] [address]      Radiology Studies: Ct Chest W Contrast  Result Date: 05/07/2019 CLINICAL DATA:  Shortness of breath, fever, acute respiratory illness, persistent cough EXAM: CT CHEST WITH CONTRAST TECHNIQUE: Multidetector CT imaging of the chest was performed during intravenous contrast administration. Sagittal and coronal MPR images reconstructed from axial data set. CONTRAST:  63mL OMNIPAQUE IOHEXOL 300 MG/ML  SOLN IV. COMPARISON:  None FINDINGS: Cardiovascular: Aorta normal caliber without aneurysm or dissection on non dedicated exam. Mild atherosclerotic calcifications at the coronary arteries. Heart unremarkable. No pericardial effusion.  Mediastinum/Nodes: Base of cervical region normal appearance. Esophagus normal appearance. Scattered normal sized mediastinal lymph nodes. No thoracic adenopathy. Lungs/Pleura: Minimal subsegmental atelectasis in lower lobes. Lungs otherwise clear. No pulmonary infiltrate, pleural effusion, pneumothorax or mass. Upper Abdomen: Probable fatty infiltration of liver. Remaining visualized upper abdomen unremarkable. Musculoskeletal: No acute osseous findings. IMPRESSION: No acute intrathoracic abnormalities. Minimal coronary arterial calcification and subsegmental atelectasis in lower lobes. Electronically Signed   By: Lavonia Dana M.D.   On: 05/07/2019 14:46          Scheduled Meds:  enoxaparin (LOVENOX) injection  40 mg Subcutaneous Q24H   fluticasone  2 spray Each Nare Daily   HYDROcodone-acetaminophen  1 tablet Oral BID   ipratropium-albuterol  3 mL Nebulization TID   pantoprazole  40 mg Oral Daily   predniSONE  40 mg Oral Q breakfast   sodium chloride flush  3 mL Intravenous Q12H   Continuous Infusions:    LOS: 1 day     Vernell Leep, MD, FACP, [facility]. [facility]  To contact the attending provider between 7A-7P or the covering provider during after hours 7P-7A, please log into the web site [email or web address] and access using universal [place] password for that web site. If you do not have the password, please call the hospital operator.  05/09/2019, 1:45 PM

## 2019-05-09 NOTE — Progress Notes (Signed)
Progress Note  Patient Name: Michael Love Date of Encounter: 05/09/2019  Primary Cardiologist:  Lujean Amel ([facility])  Subjective   SOB much improved.   Inpatient Medications    Scheduled Meds:  fluticasone  2 spray Each Nare Daily   HYDROcodone-acetaminophen  1 tablet Oral BID   ipratropium-albuterol  3 mL Nebulization TID   pantoprazole  40 mg Oral Daily   predniSONE  40 mg Oral Q breakfast   sodium chloride flush  3 mL Intravenous Q12H   Continuous Infusions:  heparin 1,350 Units/hr (05/09/19 0900)   PRN Meds: acetaminophen, albuterol, clonazePAM   Vital Signs    Vitals:   05/08/19 2349 05/09/19 0441 05/09/19 0716 05/09/19 0758  BP: (!) 147/74 (!) 146/89  135/83  Pulse: 68 67  62  Resp:    (!) 22  Temp: 98.1 F (36.7 C) 97.8 F (36.6 C)  98.1 F (36.7 C)  TempSrc: Oral Oral  Oral  SpO2: 95% 97% 97% 93%  Weight:  118.1 kg    Height:        Intake/Output Summary (Last 24 hours) at 05/09/2019 1031 Last data filed at 05/09/2019 0759 Gross per 24 hour  Intake 1013.07 ml  Output 2375 ml  Net -1361.93 ml   Last 3 Weights 05/09/2019 05/08/2019 05/07/2019  Weight (lbs) 260 lb 4.8 oz 263 lb 4.8 oz 260 lb  Weight (kg) 118.071 kg 119.432 kg 117.935 kg      Telemetry    SR- Personally Reviewed  ECG    n/a - Personally Reviewed  Physical Exam   GEN: No acute distress.   Neck: No JVD Cardiac: RRR, no murmurs, rubs, or gallops.  Respiratory: mild bilateral wheezing GI: Soft, nontender, non-distended  MS: No edema; No deformity. Neuro:  Nonfocal  Psych: Normal affect   Labs    High Sensitivity Troponin:   Recent Labs  Lab 05/07/19 0819 05/07/19 0959 05/07/19 1155 05/08/19 1042 05/09/19 0510  TROPONINIHS 108* 372* 426* 584* 437*      Chemistry Recent Labs  Lab 05/07/19 0819 05/07/19 0830 05/08/19 0208  NA 137 139 139  K 3.4* 3.6 4.3  CL 103  --  105  CO2 22  --  24  GLUCOSE 139*  --  142*  BUN 6*  --  6*    CREATININE 1.05  --  1.01  CALCIUM 8.8*  --  9.0  PROT 6.9  --   --   ALBUMIN 3.9  --   --   AST 60*  --   --   ALT 54*  --   --   ALKPHOS 60  --   --   BILITOT 0.5  --   --   GFRNONAA >60  --  >60  GFRAA >60  --  >60  ANIONGAP 12  --  10     Hematology Recent Labs  Lab 05/07/19 0819 05/07/19 0830 05/08/19 0208 05/09/19 0342  WBC 9.5  --  10.7* 16.7*  RBC 4.99  --  4.68 4.76  HGB 15.2 15.6 14.1 14.4  HCT 46.3 46.0 42.5 44.4  MCV 92.8  --  90.8 93.3  MCH 30.5  --  30.1 30.3  MCHC 32.8  --  33.2 32.4  RDW 13.6  --  13.7 14.1  PLT 264  --  280 283    BNP Recent Labs  Lab 05/07/19 0819 05/08/19 1052  BNP 31.9 612.6*     DDimer No results for input(s): DDIMER in the last  168 hours.   Radiology    Ct Chest W Contrast  Result Date: 05/07/2019 CLINICAL DATA:  Shortness of breath, fever, acute respiratory illness, persistent cough EXAM: CT CHEST WITH CONTRAST TECHNIQUE: Multidetector CT imaging of the chest was performed during intravenous contrast administration. Sagittal and coronal MPR images reconstructed from axial data set. CONTRAST:  63mL OMNIPAQUE IOHEXOL 300 MG/ML  SOLN IV. COMPARISON:  None FINDINGS: Cardiovascular: Aorta normal caliber without aneurysm or dissection on non dedicated exam. Mild atherosclerotic calcifications at the coronary arteries. Heart unremarkable. No pericardial effusion. Mediastinum/Nodes: Base of cervical region normal appearance. Esophagus normal appearance. Scattered normal sized mediastinal lymph nodes. No thoracic adenopathy. Lungs/Pleura: Minimal subsegmental atelectasis in lower lobes. Lungs otherwise clear. No pulmonary infiltrate, pleural effusion, pneumothorax or mass. Upper Abdomen: Probable fatty infiltration of liver. Remaining visualized upper abdomen unremarkable. Musculoskeletal: No acute osseous findings. IMPRESSION: No acute intrathoracic abnormalities. Minimal coronary arterial calcification and subsegmental atelectasis in  lower lobes. Electronically Signed   By: Lavonia Dana M.D.   On: 05/07/2019 14:46   Ct Abdomen Pelvis W Contrast  Result Date: 05/07/2019 CLINICAL DATA:  Abdominal distention. EXAM: CT ABDOMEN AND PELVIS WITH CONTRAST TECHNIQUE: Multidetector CT imaging of the abdomen and pelvis was performed using the standard protocol following bolus administration of intravenous contrast. CONTRAST:  145mL OMNIPAQUE IOHEXOL 300 MG/ML  SOLN COMPARISON:  CT scan dated 07/15/2017 FINDINGS: Lower chest: Normal. Hepatobiliary: No focal liver abnormality is seen. No gallstones, gallbladder wall thickening, or biliary dilatation. Pancreas: Unremarkable. No pancreatic ductal dilatation or surrounding inflammatory changes. Spleen: Normal in size without focal abnormality. Adrenals/Urinary Tract: 10 mm simple cyst on the upper pole of the right kidney. Kidneys, ureters, bladder, and adrenal glands are otherwise normal. Stomach/Bowel: The patient appears to have had resection of cecum. The stomach and small bowel are normal. Numerous diverticula in the sigmoid portion of the colon. No diverticulitis. Vascular/Lymphatic: Aortic atherosclerosis. No adenopathy. Reproductive: Prostate is unremarkable. Other: No abdominal wall hernia or abnormality. No abdominopelvic ascites. Musculoskeletal: No acute or significant osseous findings. IMPRESSION: 1. Benign-appearing abdomen and pelvis. 2. Diverticulosis of the sigmoid portion of the colon. 3. Aortic Atherosclerosis (ICD10-I70.0). Electronically Signed   By: Lorriane Shire M.D.   On: 05/07/2019 13:08    Cardiac Studies    Patient Profile        Michael A Halliburtonis a 64 y.o.malewith a hx of COPD, CHF( NICM, EF 40%), HLD,and lyme diseasewho is being seen today for the evaluation of elevated troponinat the request of Dr. Sedonia Small.  Assessment & Plan    1. Elevated troponin - peak 584 and trending down  - admitted with hypoxia into the 70s, placed on bipap initially - chest  pain  was atypical. Reports cold like symptoms for several weeks with productive cough/SOB/wheezing, several family members affected. COVID neg -04/2019 echo LVEF 35-40% (his baseline LVEF based on [facility] 2019 echo), lateral and anterior hypokinesis.  - 08/2017 nuclear stress [facility]: no ischemia  - no plans for ischemic testing. Can d/c heparin and start lovenix DVT prophylaxis.  -I suspect demand ischemia in setting of respiraotry distress and severe hypoxia on admission. Negative stress test 1 year ago, stable LVEF by echo.   2. SOB - admitted with hypoxia into the 70s, placed on bipap initially - presented with SOB, productive cough, wheezing. Does not have smoking history - COVID neg  3. NICM - -04/2019 echo LVEF 35-40% (his baseline LVEF based on [facility] 2019 echo), lateral and anterior hypokinesis.  - 08/2017  nuclear stress [facility]: no ischemia  - he does not appear volume overloaded on exam however dificult assessment due to body habitus. CXR without edema or consolidation, CT chest without acute process. BNP was however elevated and thus gave a dose of IV lasix given his ongoing SOB - negative 1.7 L since admit. Does not have repeat labs. Symptoms are improving, redose IV lasix 40mg  again today.  - would start lasix oral 40mg  daily likely tomorrow.    4. History of lyme disease    He is followed by [facility] cardiology, will need close f/u with BMET at discharge Would monitor one more day, possible d/c tomorrow.    For questions or updates, please contact [place] Please consult [email or web address] for contact info under        Signed, Carlyle Dolly, MD  05/09/2019, 10:31 AM

## 2019-05-10 ENCOUNTER — Encounter (HOSPITAL_COMMUNITY): Payer: Self-pay | Admitting: General Practice

## 2019-05-10 DIAGNOSIS — J441 Chronic obstructive pulmonary disease with (acute) exacerbation: Principal | ICD-10-CM

## 2019-05-10 LAB — HEPATIC FUNCTION PANEL
ALT: 30 U/L (ref 0–44)
AST: 24 U/L (ref 15–41)
Albumin: 3.4 g/dL — ABNORMAL LOW (ref 3.5–5.0)
Alkaline Phosphatase: 47 U/L (ref 38–126)
Bilirubin, Direct: <0.1 mg/dL (ref 0.0–0.2)
Total Bilirubin: 0.7 mg/dL (ref 0.3–1.2)
Total Protein: 6.6 g/dL (ref 6.5–8.1)

## 2019-05-10 LAB — CBC
HCT: 45.2 % (ref 39.0–52.0)
Hemoglobin: 14.9 g/dL (ref 13.0–17.0)
MCH: 30.7 pg (ref 26.0–34.0)
MCHC: 33 g/dL (ref 30.0–36.0)
MCV: 93 fL (ref 80.0–100.0)
Platelets: 272 10*3/uL (ref 150–400)
RBC: 4.86 MIL/uL (ref 4.22–5.81)
RDW: 14.1 % (ref 11.5–15.5)
WBC: 12 10*3/uL — ABNORMAL HIGH (ref 4.0–10.5)
nRBC: 0 % (ref 0.0–0.2)

## 2019-05-10 LAB — BASIC METABOLIC PANEL
Anion gap: 10 (ref 5–15)
BUN: 16 mg/dL (ref 8–23)
CO2: 28 mmol/L (ref 22–32)
Calcium: 8.8 mg/dL — ABNORMAL LOW (ref 8.9–10.3)
Chloride: 102 mmol/L (ref 98–111)
Creatinine, Ser: 1.01 mg/dL (ref 0.61–1.24)
GFR calc Af Amer: >60 mL/min (ref >60)
GFR calc non Af Amer: >60 mL/min (ref >60)
Glucose, Bld: 87 mg/dL (ref 70–99)
Potassium: 3.6 mmol/L (ref 3.5–5.1)
Sodium: 140 mmol/L (ref 135–145)

## 2019-05-10 MED ORDER — FUROSEMIDE 40 MG PO TABS
40.0000 mg | ORAL_TABLET | Freq: Every day | ORAL | Status: DC
  Administered 2019-05-10: 40 mg via ORAL
  Filled 2019-05-10: qty 1

## 2019-05-10 MED ORDER — LOSARTAN POTASSIUM 25 MG PO TABS
25.0000 mg | ORAL_TABLET | Freq: Every day | ORAL | Status: DC
  Administered 2019-05-10: 25 mg via ORAL
  Filled 2019-05-10: qty 1

## 2019-05-10 MED ORDER — FUROSEMIDE 40 MG PO TABS: 40.0000 mg | ORAL_TABLET | Freq: Every day | ORAL | 1 refills | Status: AC

## 2019-05-10 MED ORDER — IPRATROPIUM-ALBUTEROL 0.5-2.5 (3) MG/3ML IN SOLN: 3.0000 mL | Freq: Three times a day (TID) | RESPIRATORY_TRACT | 1 refills | Status: AC

## 2019-05-10 MED ORDER — PREDNISONE 20 MG PO TABS: ORAL_TABLET | ORAL | 0 refills | Status: AC

## 2019-05-10 MED ORDER — LOSARTAN POTASSIUM 25 MG PO TABS: 25.0000 mg | ORAL_TABLET | Freq: Every day | ORAL | 1 refills | Status: AC

## 2019-05-10 MED ORDER — PANTOPRAZOLE SODIUM 40 MG PO TBEC: 40.0000 mg | DELAYED_RELEASE_TABLET | Freq: Every day | ORAL | 0 refills | Status: AC

## 2019-05-10 MED ORDER — FLUTICASONE PROPIONATE 50 MCG/ACT NA SUSP: 2.0000 | Freq: Every day | NASAL | 1 refills | Status: AC

## 2019-05-10 NOTE — Progress Notes (Signed)
Patient ambulated in hall on room air. Oxygen saturation noted at 92% at rest on room air and 91% while ambulating on room air. Patient denies shortness of breath or discomfort breathing.

## 2019-05-10 NOTE — Progress Notes (Signed)
Discharge education and medication education given to patient and spouse with teach back. Education on low sodium diet and increasing activity slowly given. All questions and concerns answered. Peripheral IV and telemetry leads removed. All patient belongings given to patient, these include: cell phone and charger, clothing, hat, and shoes. Patient transported to main entrance by nurse tech via wheelchair.

## 2019-05-10 NOTE — Progress Notes (Signed)
  Mobility Specialist Criteria Algorithm Info.  SATURATION QUALIFICATIONS: (This note is used to comply with regulatory documentation for home oxygen)  Patient Saturations on Room Air at Rest = 97%  Patient Saturations on Room Air while Ambulating = 95%  Patient Saturations on N/A Liters of oxygen while Ambulating = N/A%  Please briefly explain why patient needs home oxygen:  Mobility:  HOB elevated:Self regulated Activity: Ambulated in hall;Transferred:  Bed to chair (to chair after ambulation) Range of motion: Active;All extremities Level of assistance: Independent Assistive device: None Minutes stood: 5 minutes Minutes ambulated: 5 minutes Distance ambulated (ft): 500 ft Mobility response: Tolerated well (SOB increases w/ ambulation distance) Bed Position: Chair Mobility Status: Yes, no lift needed Level of Assistance: Independent   05/10/2019 12:08 PM

## 2019-05-10 NOTE — Progress Notes (Addendum)
Progress Note  Patient Name: Michael Love Date of Encounter: 05/10/2019  Primary Cardiologist: No primary care provider on file.   Subjective   States breathing is better. Walked in the hallway  Inpatient Medications    Scheduled Meds: . enoxaparin (LOVENOX) injection  40 mg Subcutaneous Q24H  . fluticasone  2 spray Each Nare Daily  . HYDROcodone-acetaminophen  1 tablet Oral BID  . ipratropium-albuterol  3 mL Nebulization TID  . losartan  25 mg Oral Daily  . pantoprazole  40 mg Oral Daily  . predniSONE  40 mg Oral Q breakfast  . sodium chloride flush  3 mL Intravenous Q12H   Continuous Infusions:  PRN Meds: acetaminophen, albuterol, clonazePAM   Vital Signs    Vitals:   05/09/19 2354 05/10/19 0353 05/10/19 0750 05/10/19 0837  BP: 136/76 (!) 152/83 (!) 144/83   Pulse: (!) 59 (!) 56 61   Resp: 16 18    Temp: 98.7 F (37.1 C) 97.8 F (36.6 C) 98.2 F (36.8 C)   TempSrc: Oral Oral Oral   SpO2: 96% 95% 96% 96%  Weight:  116.6 kg    Height:        Intake/Output Summary (Last 24 hours) at 05/10/2019 0855 Last data filed at 05/10/2019 0542 Gross per 24 hour  Intake 480 ml  Output 2300 ml  Net -1820 ml   Last 3 Weights 05/10/2019 05/09/2019 05/08/2019  Weight (lbs) 257 lb 260 lb 4.8 oz 263 lb 4.8 oz  Weight (kg) 116.574 kg 118.071 kg 119.432 kg      Telemetry    SR - Personally Reviewed  ECG    No new tracing.  Physical Exam  Pleasant older WM GEN: No acute distress.   Neck: No JVD Cardiac: RRR, no murmurs, rubs, or gallops.  Respiratory: Clear to auscultation bilaterally. GI: Soft, nontender, non-distended  MS: No edema; No deformity. Neuro:  Nonfocal  Psych: Normal affect   Labs    High Sensitivity Troponin:   Recent Labs  Lab 05/07/19 0819 05/07/19 0959 05/07/19 1155 05/08/19 1042 05/09/19 0510  TROPONINIHS 108* 372* 426* 584* 437*      Chemistry Recent Labs  Lab 05/07/19 0819 05/07/19 0830 05/08/19 0208 05/10/19  0405  NA 137 139 139 140  K 3.4* 3.6 4.3 3.6  CL 103  --  105 102  CO2 22  --  24 28  GLUCOSE 139*  --  142* 87  BUN 6*  --  6* 16  CREATININE 1.05  --  1.01 1.01  CALCIUM 8.8*  --  9.0 8.8*  PROT 6.9  --   --  6.6  ALBUMIN 3.9  --   --  3.4*  AST 60*  --   --  24  ALT 54*  --   --  30  ALKPHOS 60  --   --  47  BILITOT 0.5  --   --  0.7  GFRNONAA >60  --  >60 >60  GFRAA >60  --  >60 >60  ANIONGAP 12  --  10 10     Hematology Recent Labs  Lab 05/08/19 0208 05/09/19 0342 05/10/19 0405  WBC 10.7* 16.7* 12.0*  RBC 4.68 4.76 4.86  HGB 14.1 14.4 14.9  HCT 42.5 44.4 45.2  MCV 90.8 93.3 93.0  MCH 30.1 30.3 30.7  MCHC 33.2 32.4 33.0  RDW 13.7 14.1 14.1  PLT 280 283 272    BNP Recent Labs  Lab 05/07/19 0819 05/08/19 1052  BNP 31.9 612.6*     DDimer No results for input(s): DDIMER in the last 168 hours.   Radiology    No results found.  Cardiac Studies   TTE: 05/07/19  IMPRESSIONS    1. Moderately to severely reduced LV EF with hypokinesis of entire anterior and anterolateral walls. No prior images for comparison.  2. Left ventricular ejection fraction, by visual estimation, is 35 to 40%. The left ventricle has moderate to severely decreased function. There is borderline left ventricular hypertrophy.  3. Entire lateral wall and entire anterior wall are abnormal.  4. Abnormal septal motion consistent with left bundle branch block.  5. The left ventricle demonstrates regional wall motion abnormalities.  6. Global right ventricle has normal systolic function.The right ventricular size is normal. No increase in right ventricular wall thickness.  7. Left atrial size was normal.  8. Right atrial size was normal.  9. Presence of pericardial fat pad. 10. Trivial pericardial effusion is present. 11. Mild aortic valve annular calcification. 12. The mitral valve is normal in structure. Trace mitral valve regurgitation. 13. The tricuspid valve is normal in structure.  Tricuspid valve regurgitation is not demonstrated. 14. The aortic valve is tricuspid. Aortic valve regurgitation is not visualized. No evidence of aortic valve sclerosis or stenosis. 15. The pulmonic valve was grossly normal. Pulmonic valve regurgitation is not visualized.   Patient Profile     63 y.o. male with a hx of COPD, CHF( NICM, EF 40%), HLD,and lyme diseasewho was seen for the evaluation of elevated troponin and CHF.  Assessment & Plan    1. Elevated Troponin: in the setting of acute hypoxia requiring Bipap. Troponin peaked at 584. Did report chest pain, but atypical in nature. [facility]/2017 showing no ischemia. Elevated troponin felt to be demand ischemia.   2. Acute hypoxia: suspect 2/2 to asthma exacerbation possible viral illness. Initially required Bipap but now weaned to RA. Procal negative. On steroid taper. Management per IM.   3. NICM: EF 35-40%. Notes indicate he was initially placed on metoprolol and losartan after initial diagnosis but stopped taking. Net - 3.7L, weight down 260>>257lbs. He has been bradycardic, therefore will avoid BB for now. Add low dose ARB. Transition to lasix 40mg  PO daily.  4. Hx of Lyme Disease  5. S/p right hemicolectomy: Hx of adenomatous polyp and father with colon Ca.  Has been seeing Dr. Clayborn Bigness in the past. Asked that he arrange for follow up within the next 2 weeks.   For questions or updates, please contact [place] Please consult [email or web address] for contact info under        Signed, Reino Bellis, NP  05/10/2019, 8:55 AM    I have personally seen and examined this patient with Reino Bellis, NP. I agree with the assessment and plan as outlined above. He is doing well this am. Reports much improvement in breathing. Down 3.7 L. No beta blocker with bradycardia. Add ARB. Will change to po Lasix 40 mg daily. OK to d/c home today. He can follow up with Dr. Clayborn Bigness at [facility].   Lauree Chandler  05/10/2019 9:58 AM

## 2019-05-10 NOTE — Progress Notes (Signed)
SATURATION QUALIFICATIONS: (This note is used to comply with regulatory documentation for home oxygen)  Patient Saturations on Room Air at Rest = 92%  Patient Saturations on Room Air while Ambulating = 91%

## 2019-05-11 NOTE — Discharge Summary (Signed)
Physician Discharge Summary  Michael Love [id number] DOB: 03-18-1956 DOA: 05/07/2019  PCP: Lawerance Cruel, MD  Admit date: 05/07/2019 Discharge date: 05/10/2019  Admitted From: home.  Disposition: Home.   Recommendations for Outpatient Follow-up:  1. Follow up with PCP in 1-2 weeks 2. Please obtain BMP/CBC in one week Please follow up with cardiology as recommended.  Please follow up with pulmonology for evaluation of PFTS.    Home Health:none.   Discharge Condition:stable.  CODE STATUS:full code.  Diet recommendation: Heart Healthy / Carb Modified   Brief/Interim Summary: 63 year old married male, retired, independent of activities, PMH of NICM (EF 40%), childhood asthma, denies COPD, remote thyroid storm, Lyme disease, laparoscopic right hemicolectomy at [facility] in March 2019, anxiety/panic disorder, reports chronic dyspnea on exertion since hemicolectomy in March 2019, reports approximately 2 months history of worsening dyspnea, especially at nights, wakes up at times with cough and thick sputum, dyspnea/?  Orthopnea, occasional atypical chest pain, recent exposure to multiple family members with URTI symptoms and all have tested negative for COVID-19 presented with worsening dyspnea, hypoxia in the 70s requiring BiPAP, magnesium, albuterol and Solu-Medrol by EMS.  Admitted for acute respiratory failure with hypoxia, acute bronchitis, elevated troponin felt to be due to demand ischemia.    Discharge Diagnoses:  Principal Problem:   Acute respiratory failure ([place]) Active Problems:   Adenomatous polyp of ascending colon   Somatic symptom disorder   Elevated troponin   Acute respiratory failure with hypoxia (HCC)   Acute bronchitis  Acute respiratory failure with hypoxia/acute viral bronchitis/?  Asthma exacerbation  Infectious work-up thus far negative.  No leukocytosis.  Procalcitonin <0.10.  HIV screen negative.  RSV panel and COVID-19 testing negative.  CT  chest with contrast: No acute intrathoracic abnormalities.  Bilateral lower lobe subsegmental atelectasis.  Chest x-ray on admission unremarkable.  Initially  requiring BiPAP when evaluated by EMS, later on transitioned to RA.   Plan for prednisone taper for the next few days.   Although denies reflux symptoms, his nocturnal cough could be related to GERD and initiated PPI.  Chronic exertional dyspnea may be cardiac in nature but recommend outpatient pulmonology consultation for further evaluation including PFTs.  Overall much better.  ambulating oxygen levels greater than 92%/   Elevated troponin  No typical chest pain.  HS troponin gradually uptrending: 108 > 372 > 426 > 584.  Now starting to downtrend.  TTE 11/20: LVEF 35-40%, hypokinesis of entire anterior and anterolateral walls.  EF seems to be at prior baseline.  08/2017 [facility] at [facility] without ischemia.  Cardiology follow-up appreciated, discussed with Dr. Harl Bowie.  Discontinued IV heparin infusion and switched to Lovenox DVT prophylactic dose.  No further ischemic work-up plan.  NICM/acute on chronic systolic CHF  TTE results as noted above.  Chest x-ray and CT chest without acute abnormalities.  Clinically does not appear overtly volume overloaded but volume status difficult to assess due to body habitus.  BNP however is elevated 612.6.  S/p IV Lasix 40 mg x 1 yesterday, -1.9 L, cardiology recommending to discharge on oral lasix 40 mg daily.     Follow-up with dr Clayborn Bigness at [facility]  Hypokalemia Replaced.   Mild transaminitis  Unclear etiology.  CT abdomen showed benign-appearing abdomen and pelvis.  No GI symptoms.   S/p right hemicolectomy  Reportedly for history of adenomatous polyp  CT abdomen with contrast without acute findings.  Showed sigmoid diverticulosis.  FOBT negative.  Body mass index is 35.3 kg/m./Obesity  Anxiety/panic disorder  Continue as needed Klonopin.      Discharge Instructions  Discharge Instructions    Diet - low sodium heart healthy   Complete by: As directed    Discharge instructions   Complete by: As directed    Please follow up with cardiology as recommended.     Allergies as of 05/10/2019   No Known Allergies     Medication List    STOP taking these medications   gabapentin 300 MG capsule Commonly known as: NEURONTIN   GOODY HEADACHE PO     TAKE these medications   albuterol 108 (90 Base) MCG/ACT inhaler Commonly known as: VENTOLIN HFA Inhale 2 puffs into the lungs every 4 (four) hours as needed for wheezing or shortness of breath.   BEE POLLEN PO Take 1 capsule by mouth every other day.   clonazePAM 1 MG tablet Commonly known as: KLONOPIN Take 0.5 mg by mouth 2 (two) times daily.   fluticasone 50 MCG/ACT nasal spray Commonly known as: FLONASE Place 2 sprays into both nostrils daily.   furosemide 40 MG tablet Commonly known as: LASIX Take 1 tablet (40 mg total) by mouth daily.   HYDROcodone-acetaminophen 5-325 MG tablet Commonly known as: NORCO/VICODIN Take 1 tablet by mouth 2 (two) times daily.   ipratropium-albuterol 0.5-2.5 (3) MG/3ML Soln Commonly known as: DUONEB Take 3 mLs by nebulization 3 (three) times daily.   losartan 25 MG tablet Commonly known as: COZAAR Take 1 tablet (25 mg total) by mouth daily.   OVER THE COUNTER MEDICATION Take 1 packet by mouth every other day. OTC packet of vegie and fruit tablets   pantoprazole 40 MG tablet Commonly known as: PROTONIX Take 1 tablet (40 mg total) by mouth daily.   predniSONE 20 MG tablet Commonly known as: DELTASONE Prednisone 40 mg daily for 2 days followed by  Prednisone 20 mg daily for 3 days.   ROYAL JELLY PO Take 1 capsule by mouth every other day.   SEVERE COLD/FLU PO Take 1 capsule by mouth every 6 (six) hours as needed (cough/cold symptoms).      Follow-up Information    Lawerance Cruel, MD. Go on 05/19/2019.    Specialty: Family Medicine Why: @2 :30pm Contact information: [place] [address] [phone]          No Known Allergies  Consultations:  cardiology   Procedures/Studies: Ct Chest W Contrast  Result Date: 05/07/2019 CLINICAL DATA:  Shortness of breath, fever, acute respiratory illness, persistent cough EXAM: CT CHEST WITH CONTRAST TECHNIQUE: Multidetector CT imaging of the chest was performed during intravenous contrast administration. Sagittal and coronal MPR images reconstructed from axial data set. CONTRAST:  35mL OMNIPAQUE IOHEXOL 300 MG/ML  SOLN IV. COMPARISON:  None FINDINGS: Cardiovascular: Aorta normal caliber without aneurysm or dissection on non dedicated exam. Mild atherosclerotic calcifications at the coronary arteries. Heart unremarkable. No pericardial effusion. Mediastinum/Nodes: Base of cervical region normal appearance. Esophagus normal appearance. Scattered normal sized mediastinal lymph nodes. No thoracic adenopathy. Lungs/Pleura: Minimal subsegmental atelectasis in lower lobes. Lungs otherwise clear. No pulmonary infiltrate, pleural effusion, pneumothorax or mass. Upper Abdomen: Probable fatty infiltration of liver. Remaining visualized upper abdomen unremarkable. Musculoskeletal: No acute osseous findings. IMPRESSION: No acute intrathoracic abnormalities. Minimal coronary arterial calcification and subsegmental atelectasis in lower lobes. Electronically Signed   By: Lavonia Dana M.D.   On: 05/07/2019 14:46   Ct Abdomen Pelvis W Contrast  Result Date: 05/07/2019 CLINICAL DATA:  Abdominal distention. EXAM: CT ABDOMEN AND  PELVIS WITH CONTRAST TECHNIQUE: Multidetector CT imaging of the abdomen and pelvis was performed using the standard protocol following bolus administration of intravenous contrast. CONTRAST:  171mL OMNIPAQUE IOHEXOL 300 MG/ML  SOLN COMPARISON:  CT scan dated 07/15/2017 FINDINGS: Lower chest: Normal. Hepatobiliary: No focal liver  abnormality is seen. No gallstones, gallbladder wall thickening, or biliary dilatation. Pancreas: Unremarkable. No pancreatic ductal dilatation or surrounding inflammatory changes. Spleen: Normal in size without focal abnormality. Adrenals/Urinary Tract: 10 mm simple cyst on the upper pole of the right kidney. Kidneys, ureters, bladder, and adrenal glands are otherwise normal. Stomach/Bowel: The patient appears to have had resection of cecum. The stomach and small bowel are normal. Numerous diverticula in the sigmoid portion of the colon. No diverticulitis. Vascular/Lymphatic: Aortic atherosclerosis. No adenopathy. Reproductive: Prostate is unremarkable. Other: No abdominal wall hernia or abnormality. No abdominopelvic ascites. Musculoskeletal: No acute or significant osseous findings. IMPRESSION: 1. Benign-appearing abdomen and pelvis. 2. Diverticulosis of the sigmoid portion of the colon. 3. Aortic Atherosclerosis (ICD10-I70.0). Electronically Signed   By: Lorriane Shire M.D.   On: 05/07/2019 13:08   Xr Chest Single View  Result Date: 05/07/2019 CLINICAL DATA:  Shortness of breath EXAM: PORTABLE CHEST 1 VIEW COMPARISON:  August 12, 2017 FINDINGS: There is no edema or consolidation. Heart is upper normal in size with pulmonary vascularity normal. No adenopathy. There is degenerative change in the thoracic spine. IMPRESSION: No edema or consolidation.  Heart upper normal in size. Electronically Signed   By: Lowella Grip III M.D.   On: 05/07/2019 08:38      Subjective: No new complaints.   Discharge Exam: Vitals:   05/10/19 0837 05/10/19 0930  BP:  125/88  Pulse:  86  Resp:    Temp:    SpO2: 96%    Vitals:   05/10/19 0353 05/10/19 0750 05/10/19 0837 05/10/19 0930  BP: (!) 152/83 (!) 144/83  125/88  Pulse: (!) 56 61  86  Resp: 18     Temp: 97.8 F (36.6 C) 98.2 F (36.8 C)    TempSrc: Oral Oral    SpO2: 95% 96% 96%   Weight: 116.6 kg     Height:        General: Pt is alert,  awake, not in acute distress Cardiovascular: RRR, S1/S2 +, no rubs, no gallops Respiratory: CTA bilaterally, no wheezing, no rhonchi Abdominal: Soft, NT, ND, bowel sounds + Extremities: leg edema present.     The results of significant diagnostics from this hospitalization (including imaging, microbiology, ancillary and laboratory) are listed below for reference.     Microbiology: Recent Results (from the past 240 hour(s))  Blood Culture x 1     Status: None (Preliminary result)   Collection Time: 05/07/19  8:19 AM   Specimen: BLOOD LEFT HAND  Result Value Ref Range Status   Specimen Description BLOOD LEFT HAND  Final   Special Requests   Final    BOTTLES DRAWN AEROBIC AND ANAEROBIC Blood Culture adequate volume   Culture   Final    NO GROWTH 3 DAYS Performed at [facility], [address], [place], [place] [address]    Report Status PENDING  Incomplete  SARS CORONAVIRUS 2 (TAT 6-24 HRS) Nasopharyngeal Nasopharyngeal Swab     Status: None   Collection Time: 05/07/19  9:34 AM   Specimen: Nasopharyngeal Swab  Result Value Ref Range Status   SARS Coronavirus 2 NEGATIVE NEGATIVE Final    Comment: (NOTE) SARS-CoV-2 target nucleic acids are NOT DETECTED.  The SARS-CoV-2 RNA is generally detectable in upper and lower respiratory specimens during the acute phase of infection. Negative results do not preclude SARS-CoV-2 infection, do not rule out co-infections with other pathogens, and should not be used as the sole basis for treatment or other patient management decisions. Negative results must be combined with clinical observations, patient history, and epidemiological information. The expected result is Negative. Fact Sheet for Patients: SugarRoll.be Fact Sheet for Healthcare Providers: https://www.woods-mathews.com/ This test is not yet approved or cleared by the [place] FDA and  has been authorized for detection and/or  diagnosis of SARS-CoV-2 by FDA under an Emergency Use Authorization (EUA). This EUA will remain  in effect (meaning this test can be used) for the duration of the COVID-19 declaration under Section 56 4(b)(1) of the Act, 21 U.S.C. section 360bbb-3(b)(1), unless the authorization is terminated or revoked sooner. Performed at [facility], [place] [address], [place], [place] [address]   Respiratory Panel by PCR     Status: None   Collection Time: 05/07/19 11:55 AM   Specimen: Nasopharyngeal Swab; Respiratory  Result Value Ref Range Status   Adenovirus NOT DETECTED NOT DETECTED Final   Coronavirus 229E NOT DETECTED NOT DETECTED Final    Comment: (NOTE) The Coronavirus on the Respiratory Panel, DOES NOT test for the novel  Coronavirus (2019 nCoV)    Coronavirus HKU1 NOT DETECTED NOT DETECTED Final   Coronavirus NL63 NOT DETECTED NOT DETECTED Final   Coronavirus OC43 NOT DETECTED NOT DETECTED Final   Metapneumovirus NOT DETECTED NOT DETECTED Final   Rhinovirus / Enterovirus NOT DETECTED NOT DETECTED Final   Influenza A NOT DETECTED NOT DETECTED Final   Influenza B NOT DETECTED NOT DETECTED Final   Parainfluenza Virus 1 NOT DETECTED NOT DETECTED Final   Parainfluenza Virus 2 NOT DETECTED NOT DETECTED Final   Parainfluenza Virus 3 NOT DETECTED NOT DETECTED Final   Parainfluenza Virus 4 NOT DETECTED NOT DETECTED Final   Respiratory Syncytial Virus NOT DETECTED NOT DETECTED Final   Bordetella pertussis NOT DETECTED NOT DETECTED Final   Chlamydophila pneumoniae NOT DETECTED NOT DETECTED Final   Mycoplasma pneumoniae NOT DETECTED NOT DETECTED Final    Comment: Performed at [facility], [place]. [address], [place], [place] [address]     Labs: BNP (last 3 results) Recent Labs    05/07/19 0819 05/08/19 1052  BNP 31.9 [id number]*   Basic Metabolic Panel: Recent Labs  Lab 05/07/19 0819 05/07/19 0830 05/08/19 0208 05/10/19 0405  NA 137 139 139 140  K 3.4* 3.6 4.3 3.6  CL 103   --  105 102  CO2 22  --  24 28  GLUCOSE 139*  --  142* 87  BUN 6*  --  6* 16  CREATININE 1.05  --  1.01 1.01  CALCIUM 8.8*  --  9.0 8.8*   Liver Function Tests: Recent Labs  Lab 05/07/19 0819 05/10/19 0405  AST 60* 24  ALT 54* 30  ALKPHOS 60 47  BILITOT 0.5 0.7  PROT 6.9 6.6  ALBUMIN 3.9 3.4*   No results for input(s): LIPASE, AMYLASE in the last 168 hours. No results for input(s): AMMONIA in the last 168 hours. CBC: Recent Labs  Lab 05/07/19 0819 05/07/19 0830 05/08/19 0208 05/09/19 0342 05/10/19 0405  WBC 9.5  --  10.7* 16.7* 12.0*  HGB 15.2 15.6 14.1 14.4 14.9  HCT 46.3 46.0 42.5 44.4 45.2  MCV 92.8  --  90.8 93.3 93.0  PLT 264  --  280 283 272   Cardiac Enzymes: No results for input(s): CKTOTAL, CKMB, CKMBINDEX, TROPONINI in the last 168 hours. BNP: Invalid input(s): POCBNP CBG: No results for input(s): GLUCAP in the last 168 hours. D-Dimer No results for input(s): DDIMER in the last 72 hours. Hgb A1c No results for input(s): HGBA1C in the last 72 hours. Lipid Profile No results for input(s): CHOL, HDL, LDLCALC, TRIG, CHOLHDL, LDLDIRECT in the last 72 hours. Thyroid function studies No results for input(s): TSH, T4TOTAL, T3FREE, THYROIDAB in the last 72 hours.  Invalid input(s): FREET3 Anemia work up No results for input(s): VITAMINB12, FOLATE, FERRITIN, TIBC, IRON, RETICCTPCT in the last 72 hours. Urinalysis    Component Value Date/Time   COLORURINE STRAW (A) 02/19/2018 1313   APPEARANCEUR CLEAR (A) 02/19/2018 1313   LABSPEC 1.008 02/19/2018 1313   PHURINE 8.0 02/19/2018 1313   GLUCOSEU NEGATIVE 02/19/2018 1313   HGBUR NEGATIVE 02/19/2018 1313   BILIRUBINUR NEGATIVE 02/19/2018 1313   KETONESUR NEGATIVE 02/19/2018 1313   PROTEINUR NEGATIVE 02/19/2018 1313   UROBILINOGEN 0.2 05/19/2011 1833   NITRITE NEGATIVE 02/19/2018 1313   LEUKOCYTESUR NEGATIVE 02/19/2018 1313   Sepsis Labs Invalid input(s): PROCALCITONIN,  WBC,   LACTICIDVEN Microbiology Recent Results (from the past 240 hour(s))  Blood Culture x 1     Status: None (Preliminary result)   Collection Time: 05/07/19  8:19 AM   Specimen: BLOOD LEFT HAND  Result Value Ref Range Status   Specimen Description BLOOD LEFT HAND  Final   Special Requests   Final    BOTTLES DRAWN AEROBIC AND ANAEROBIC Blood Culture adequate volume   Culture   Final    NO GROWTH 3 DAYS Performed at [facility], [place] [address], [place], [place] [address]    Report Status PENDING  Incomplete  SARS CORONAVIRUS 2 (TAT 6-24 HRS) Nasopharyngeal Nasopharyngeal Swab     Status: None   Collection Time: 05/07/19  9:34 AM   Specimen: Nasopharyngeal Swab  Result Value Ref Range Status   SARS Coronavirus 2 NEGATIVE NEGATIVE Final    Comment: (NOTE) SARS-CoV-2 target nucleic acids are NOT DETECTED. The SARS-CoV-2 RNA is generally detectable in upper and lower respiratory specimens during the acute phase of infection. Negative results do not preclude SARS-CoV-2 infection, do not rule out co-infections with other pathogens, and should not be used as the sole basis for treatment or other patient management decisions. Negative results must be combined with clinical observations, patient history, and epidemiological information. The expected result is Negative. Fact Sheet for Patients: SugarRoll.be Fact Sheet for Healthcare Providers: https://www.woods-mathews.com/ This test is not yet approved or cleared by the [place] FDA and  has been authorized for detection and/or diagnosis of SARS-CoV-2 by FDA under an Emergency Use Authorization (EUA). This EUA will remain  in effect (meaning this test can be used) for the duration of the COVID-19 declaration under Section 56 4(b)(1) of the Act, 21 U.S.C. section 360bbb-3(b)(1), unless the authorization is terminated or revoked sooner. Performed at [facility], [place] [address],  [place], [place] [address]   Respiratory Panel by PCR     Status: None   Collection Time: 05/07/19 11:55 AM   Specimen: Nasopharyngeal Swab; Respiratory  Result Value Ref Range Status   Adenovirus NOT DETECTED NOT DETECTED Final   Coronavirus 229E NOT DETECTED NOT DETECTED Final    Comment: (NOTE) The Coronavirus on the Respiratory Panel, DOES NOT test for the novel  Coronavirus (2019 nCoV)    Coronavirus  HKU1 NOT DETECTED NOT DETECTED Final   Coronavirus NL63 NOT DETECTED NOT DETECTED Final   Coronavirus OC43 NOT DETECTED NOT DETECTED Final   Metapneumovirus NOT DETECTED NOT DETECTED Final   Rhinovirus / Enterovirus NOT DETECTED NOT DETECTED Final   Influenza A NOT DETECTED NOT DETECTED Final   Influenza B NOT DETECTED NOT DETECTED Final   Parainfluenza Virus 1 NOT DETECTED NOT DETECTED Final   Parainfluenza Virus 2 NOT DETECTED NOT DETECTED Final   Parainfluenza Virus 3 NOT DETECTED NOT DETECTED Final   Parainfluenza Virus 4 NOT DETECTED NOT DETECTED Final   Respiratory Syncytial Virus NOT DETECTED NOT DETECTED Final   Bordetella pertussis NOT DETECTED NOT DETECTED Final   Chlamydophila pneumoniae NOT DETECTED NOT DETECTED Final   Mycoplasma pneumoniae NOT DETECTED NOT DETECTED Final    Comment: Performed at [facility], [place] [address], [place], [place] [address]     Time coordinating discharge:  32 minutes  SIGNED:   Hosie Poisson, MD  [facility] 05/11/2019, 9:44 AM

## 2019-05-12 LAB — CULTURE, BLOOD (SINGLE)
Culture: NO GROWTH
Special Requests: ADEQUATE

## 2019-07-10 ENCOUNTER — Emergency Department (HOSPITAL_COMMUNITY): Payer: 59

## 2019-07-10 ENCOUNTER — Encounter (HOSPITAL_COMMUNITY): Payer: Self-pay | Admitting: Emergency Medicine

## 2019-07-10 ENCOUNTER — Emergency Department (HOSPITAL_COMMUNITY)
Admission: EM | Admit: 2019-07-10 | Discharge: 2019-07-19 | Disposition: E | Payer: 59 | Attending: Emergency Medicine | Admitting: Emergency Medicine

## 2019-07-10 DIAGNOSIS — J9601 Acute respiratory failure with hypoxia: Secondary | ICD-10-CM | POA: Insufficient documentation

## 2019-07-10 DIAGNOSIS — Z20822 Contact with and (suspected) exposure to covid-19: Secondary | ICD-10-CM | POA: Insufficient documentation

## 2019-07-10 DIAGNOSIS — J9602 Acute respiratory failure with hypercapnia: Secondary | ICD-10-CM | POA: Insufficient documentation

## 2019-07-10 DIAGNOSIS — G931 Anoxic brain damage, not elsewhere classified: Secondary | ICD-10-CM | POA: Diagnosis not present

## 2019-07-10 DIAGNOSIS — I469 Cardiac arrest, cause unspecified: Secondary | ICD-10-CM | POA: Diagnosis not present

## 2019-07-10 DIAGNOSIS — R0602 Shortness of breath: Secondary | ICD-10-CM | POA: Diagnosis present

## 2019-07-10 HISTORY — DX: Chronic obstructive pulmonary disease, unspecified: J44.9

## 2019-07-10 HISTORY — DX: Heart failure, unspecified: I50.9

## 2019-07-10 LAB — TROPONIN I (HIGH SENSITIVITY)
Troponin I (High Sensitivity): 30 ng/L — ABNORMAL HIGH (ref <18)
Troponin I (High Sensitivity): 146 ng/L (ref <18)

## 2019-07-10 LAB — PROTIME-INR
INR: 1.3 — ABNORMAL HIGH (ref 0.8–1.2)
Prothrombin Time: 15.9 seconds — ABNORMAL HIGH (ref 11.4–15.2)

## 2019-07-10 LAB — COMPREHENSIVE METABOLIC PANEL
ALT: 56 U/L — ABNORMAL HIGH (ref 0–44)
AST: 59 U/L — ABNORMAL HIGH (ref 15–41)
Albumin: 3 g/dL — ABNORMAL LOW (ref 3.5–5.0)
Alkaline Phosphatase: 58 U/L (ref 38–126)
Anion gap: 21 — ABNORMAL HIGH (ref 5–15)
BUN: 10 mg/dL (ref 8–23)
CO2: 15 mmol/L — ABNORMAL LOW (ref 22–32)
Calcium: 8.5 mg/dL — ABNORMAL LOW (ref 8.9–10.3)
Chloride: 104 mmol/L (ref 98–111)
Creatinine, Ser: 1.97 mg/dL — ABNORMAL HIGH (ref 0.61–1.24)
GFR calc Af Amer: 40 mL/min — ABNORMAL LOW (ref >60)
GFR calc non Af Amer: 35 mL/min — ABNORMAL LOW (ref >60)
Glucose, Bld: 326 mg/dL — ABNORMAL HIGH (ref 70–99)
Potassium: 4.8 mmol/L (ref 3.5–5.1)
Sodium: 140 mmol/L (ref 135–145)
Total Bilirubin: 0.6 mg/dL (ref 0.3–1.2)
Total Protein: 5.6 g/dL — ABNORMAL LOW (ref 6.5–8.1)

## 2019-07-10 LAB — CBC
HCT: 46.9 % (ref 39.0–52.0)
Hemoglobin: 14.2 g/dL (ref 13.0–17.0)
MCH: 30.5 pg (ref 26.0–34.0)
MCHC: 30.3 g/dL (ref 30.0–36.0)
MCV: 100.6 fL — ABNORMAL HIGH (ref 80.0–100.0)
Platelets: 262 10*3/uL (ref 150–400)
RBC: 4.66 MIL/uL (ref 4.22–5.81)
RDW: 13.2 % (ref 11.5–15.5)
WBC: 12.3 10*3/uL — ABNORMAL HIGH (ref 4.0–10.5)
nRBC: 0.2 % (ref 0.0–0.2)

## 2019-07-10 LAB — BRAIN NATRIURETIC PEPTIDE: B Natriuretic Peptide: 76 pg/mL (ref 0.0–100.0)

## 2019-07-10 LAB — TRIGLYCERIDES: Triglycerides: 306 mg/dL — ABNORMAL HIGH (ref <150)

## 2019-07-10 LAB — CBG MONITORING, ED: Glucose-Capillary: 204 mg/dL — ABNORMAL HIGH (ref 70–99)

## 2019-07-10 LAB — RESPIRATORY PANEL BY RT PCR (FLU A&B, COVID)
Influenza A by PCR: NEGATIVE
Influenza B by PCR: NEGATIVE
SARS Coronavirus 2 by RT PCR: NEGATIVE

## 2019-07-10 MED ORDER — IOHEXOL 350 MG/ML SOLN
100.0000 mL | Freq: Once | INTRAVENOUS | Status: AC | PRN
  Administered 2019-07-10: 100 mL via INTRAVENOUS

## 2019-07-10 MED ORDER — MIDAZOLAM HCL 2 MG/2ML IJ SOLN: 2.0000 mg | INTRAMUSCULAR | Status: DC | PRN

## 2019-07-10 MED ORDER — FENTANYL CITRATE (PF) 100 MCG/2ML IJ SOLN
100.0000 ug | INTRAMUSCULAR | Status: DC | PRN
  Administered 2019-07-10: 100 ug via INTRAVENOUS
  Filled 2019-07-10: qty 2

## 2019-07-10 MED ORDER — ALBUTEROL SULFATE (2.5 MG/3ML) 0.083% IN NEBU
INHALATION_SOLUTION | RESPIRATORY_TRACT | Status: AC
  Filled 2019-07-10: qty 3

## 2019-07-10 MED ORDER — PROPOFOL 1000 MG/100ML IV EMUL
0.0000 ug/kg/min | INTRAVENOUS | Status: DC
  Administered 2019-07-10: 5 ug/kg/min via INTRAVENOUS

## 2019-07-10 MED ORDER — EPINEPHRINE 1 MG/10ML IJ SOSY
PREFILLED_SYRINGE | INTRAMUSCULAR | Status: AC | PRN
  Administered 2019-07-10: 1 mg via INTRAVENOUS

## 2019-07-10 MED ORDER — IPRATROPIUM BROMIDE 0.02 % IN SOLN
0.5000 mg | Freq: Once | RESPIRATORY_TRACT | Status: AC
  Administered 2019-07-11: 0.5 mg via RESPIRATORY_TRACT
  Filled 2019-07-10: qty 2.5

## 2019-07-10 MED ORDER — IPRATROPIUM BROMIDE 0.02 % IN SOLN
0.5000 mg | Freq: Once | RESPIRATORY_TRACT | Status: AC
  Administered 2019-07-10: 0.5 mg via RESPIRATORY_TRACT

## 2019-07-10 MED ORDER — FENTANYL CITRATE (PF) 100 MCG/2ML IJ SOLN: 100.0000 ug | INTRAMUSCULAR | Status: DC | PRN

## 2019-07-10 MED ORDER — ALBUTEROL (5 MG/ML) CONTINUOUS INHALATION SOLN
10.0000 mg/h | INHALATION_SOLUTION | RESPIRATORY_TRACT | Status: DC
  Administered 2019-07-10: 10 mg/h via RESPIRATORY_TRACT

## 2019-07-10 MED ORDER — EPINEPHRINE 1 MG/10ML IJ SOSY
PREFILLED_SYRINGE | INTRAMUSCULAR | Status: AC | PRN
  Administered 2019-07-10 (×2): 1 mg via INTRAVENOUS

## 2019-07-10 MED ORDER — ALBUTEROL (5 MG/ML) CONTINUOUS INHALATION SOLN: 10.0000 mg/h | INHALATION_SOLUTION | RESPIRATORY_TRACT | Status: DC

## 2019-07-10 MED ORDER — IPRATROPIUM-ALBUTEROL 0.5-2.5 (3) MG/3ML IN SOLN
RESPIRATORY_TRACT | Status: AC
  Administered 2019-07-10: 3 mL via RESPIRATORY_TRACT
  Filled 2019-07-10: qty 3

## 2019-07-10 MED ORDER — MAGNESIUM SULFATE 2 GM/50ML IV SOLN
2.0000 g | Freq: Once | INTRAVENOUS | Status: AC
  Administered 2019-07-10: 2 g via INTRAVENOUS
  Filled 2019-07-10: qty 50

## 2019-07-10 MED ORDER — IPRATROPIUM-ALBUTEROL 0.5-2.5 (3) MG/3ML IN SOLN: 3.0000 mL | Freq: Once | RESPIRATORY_TRACT | Status: AC

## 2019-07-10 MED ORDER — SODIUM BICARBONATE 8.4 % IV SOLN
50.0000 meq | Freq: Once | INTRAVENOUS | Status: AC
  Administered 2019-07-10: 50 meq via INTRAVENOUS

## 2019-07-10 MED ORDER — SODIUM CHLORIDE 0.9 % IV BOLUS
1000.0000 mL | Freq: Once | INTRAVENOUS | Status: AC
  Administered 2019-07-10: 1000 mL via INTRAVENOUS

## 2019-07-10 MED ORDER — METHYLPREDNISOLONE SODIUM SUCC 125 MG IJ SOLR
125.0000 mg | Freq: Once | INTRAMUSCULAR | Status: AC
  Administered 2019-07-10: 125 mg via INTRAVENOUS
  Filled 2019-07-10: qty 2

## 2019-07-10 MED ORDER — PROPOFOL 1000 MG/100ML IV EMUL
INTRAVENOUS | Status: AC
  Filled 2019-07-10: qty 100

## 2019-07-10 MED ORDER — MIDAZOLAM HCL 2 MG/2ML IJ SOLN
2.0000 mg | INTRAMUSCULAR | Status: DC | PRN
  Administered 2019-07-11: 1 mg via INTRAVENOUS

## 2019-07-10 NOTE — ED Provider Notes (Signed)
INTUBATION Performed by: Kizzie Fantasia , MD Authorized by: Lajean Saver, MD  Required items: required blood products, implants, devices, and special equipment available Patient identity confirmed: provided demographic data and hospital-assigned identification number Time out: Immediately prior to procedure a "time out" was called to verify the correct patient, procedure, equipment, support staff and site/side marked as required.  Indications: Airway protection, hypoxic respiratory failure  Intubation method: Glidescope Laryngoscopy   Preoxygenation: King airway  None-Patient unresponsive Tube Size: 7.5 cuffed  Post-procedure assessment: chest rise and ETCO2 monitor Breath sounds: equal and absent over the epigastrium Tube secured with: ETT holder Chest x-ray interpreted by radiologist and me.  Chest x-ray findings: endotracheal tube in appropriate position, mildly high, instructed to advance by 1cm.   Patient tolerated the procedure well with no immediate complications.      Kizzie Fantasia, MD 06/28/2019 Michael Love    Lajean Saver, MD 07/07/2019 2241

## 2019-07-10 NOTE — Significant Event (Signed)
Cardiology [place] Note  Contacted by ED regarding Michael Love, who was at home earlier and suddenly collapsed after feeling short of breath. He was found to be in PEA arrest when EMS arrived. He received prolonged CPR, eventually receiving shock for VF. He had recurrent arrest in the ED but has now had sustained ROSC for approximately 10 minutes. His wife is not currently available for collateral.   Review of the patients ECG reveals LBBB, consistent with his prior ECGs. There is no evidence of ST elevation, by Sgarbossa criteria or otherwise, thus there is currently no indication for emergent coronary angiography. Additional diagnostic workup is ordered and pending. The patient will be admitted to the ICU. He remains in critical condition.   Marcie Mowers, MD Cardiology Fellow, PGY-7

## 2019-07-10 NOTE — ED Triage Notes (Signed)
Pt BIB [facility] from home as CPR in progress. Prior to EMS arrival, pt c/o shortness of breath, went outside and collapsed. Hx CHF. CPR initiated by first responders at 1807, initial rhythm asystole, then PEA, and then vfib, shocked at [place] with [place] at [place]. Pt lost pulses again at [place], arrived in ED as CPR in progress. Given 10 epi by EMS and additional 2 epi in ED, ROSC at 1928.

## 2019-07-10 NOTE — ED Provider Notes (Signed)
[facility] EMERGENCY DEPARTMENT Provider Note   CSN: [id number] Arrival date & time: 07/03/2019  1921     History Chief Complaint  Patient presents with   Cardiac Arrest    Michael Love is a 64 y.o. male.  Patient presents via EMS s/p cardiopulmonary arrest. EMS indicates that family member had noted patient c/o increased sob, and that he had gone outside 'to get some air', and then collapsed to ground. EMS was called. EMS notes initial rhythm asystole, and pt was apneic. CPR was begun. EMS placed [facility] airway. EMS indicates glucose was fine. They did give epi, and continued cpr. EMS notes their efforts were ongoing approximately 45 minutes, that co2 monitoring was 'high', and they were about to terminate efforts as pt had remained in PEA/asystole, but then rhythm changed to vtach/vfib, and they shocked x 1 after which had ROSC, femoral pulses. Post return of pulses, no other signs of neurologic response noted. On arrival to ED bed, no pulses, cpr resumed. Level 5 caveat, pt unresponsive.   The history is provided by the patient and the EMS personnel. The history is limited by the condition of the patient.  Cardiac Arrest      Past Medical History:  Diagnosis Date   Acute respiratory failure with hypoxia ([place]) 04/2019   Asthma    as a child   CHF (congestive heart failure) (H[facility])    COPD (chronic obstructive pulmonary disease) ([place])    Difficult intubation 2012   difficulty with intubation; had to use glide scope   Hyperthyroidism 20 years   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 did not need surgery or treatment, pt has not had any medication for this condition since then   Incarcerated inguinal hernia 05/19/2011   Lyme disease    Panic disorder 02/19/2018    Patient Active Problem List   Diagnosis Date Noted   Acute respiratory failure with hypoxia ([place]) 05/08/2019   Acute bronchitis    Acute respiratory failure ([place]) 05/07/2019    Elevated troponin 05/07/2019   Panic disorder 02/19/2018   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   Adenomatous polyp of ascending colon 08/18/2017   Incarcerated right inguinal hernia 05/19/2011    Past Surgical History:  Procedure Laterality Date   CYST REMOVAL HAND Left    index finger left hand   HERNIA REPAIR  1982   repaired hernia in upper abd   INGUINAL HERNIA REPAIR  05/21/2011   Procedure: HERNIA REPAIR INGUINAL ADULT;  Surgeon: Stark Klein, MD;  Location: [facility] ORS;  Service: General;  Laterality: Right;   LAPAROSCOPIC RIGHT HEMI COLECTOMY Right 08/18/2017   Procedure: LAPAROSCOPIC RIGHT HEMI COLECTOMY;  Surgeon: Herbert Pun, MD;  Location: [facility] ORS;  Service: General;  Laterality: Right;   ROTATOR CUFF REPAIR Right 2009   Dr Latanya Maudlin   TONSILLECTOMY         Family History  Problem Relation Age of Onset   Diabetes Mother    Cancer Father    Colon cancer Father    Cancer Brother     Social History   Tobacco Use   Smoking status: Former Smoker    Types: Cigarettes   Smokeless tobacco: Never Used  Substance Use Topics   Alcohol use: No    Comment: 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 Authorizing Provider  albuterol (VENTOLIN HFA)  108 (90 Base) MCG/ACT inhaler Inhale 2 puffs into the lungs every 4 (four) hours as needed for wheezing or shortness of breath.  04/16/19   [provider]  BEE POLLEN PO Take 1 capsule by mouth every other day.    [provider]  clonazePAM (KLONOPIN) 1 MG tablet Take 0.5 mg by mouth 2 (two) times daily.  05/06/17   [provider]  fluticasone (FLONASE) 50 MCG/ACT nasal spray Place 2 sprays into both nostrils daily. 05/11/19   Hosie Poisson, MD  furosemide (LASIX) 40 MG tablet Take 1 tablet (40 mg total) by mouth daily. 05/11/19   Hosie Poisson, MD    ipratropium-albuterol (DUONEB) 0.5-2.5 (3) MG/3ML SOLN Take 3 mLs by nebulization 3 (three) times daily. 05/10/19   Hosie Poisson, MD  losartan (COZAAR) 25 MG tablet Take 1 tablet (25 mg total) by mouth daily. 05/11/19   Hosie Poisson, MD  OVER THE COUNTER MEDICATION Take 1 packet by mouth every other day. OTC packet of vegie and fruit tablets    [provider]  pantoprazole (PROTONIX) 40 MG tablet Take 1 tablet (40 mg total) by mouth daily. 05/11/19   Hosie Poisson, MD  predniSONE (DELTASONE) 20 MG tablet Prednisone 40 mg daily for 2 days followed by  Prednisone 20 mg daily for 3 days. 05/10/19   Hosie Poisson, MD  Pseudoeph-CPM-DM-APAP (SEVERE COLD/FLU PO) Take 1 capsule by mouth every 6 (six) hours as needed (cough/cold symptoms).    [provider]  ROYAL JELLY PO Take 1 capsule by mouth every other day.    [provider]    Allergies    Patient has no known allergies.  Review of Systems   Review of Systems  Unable to perform ROS: Patient unresponsive  level 5 caveat, pt unresponsive.   Physical Exam Updated Vital Signs BP 111/80    Pulse 73    Resp (!) 24    Ht 1.727 m (5\' 8" )    Wt 113.4 kg    SpO2 100%    BMI 38.01 kg/m   Physical Exam Vitals and nursing note reviewed.  Constitutional:      Appearance: Normal appearance. He is well-developed.  HENT:     Head: Atraumatic.     Comments: Contusion/abrasion to forehead.     Nose: Nose normal.     Mouth/Throat:     Mouth: Mucous membranes are moist.     Comments: King airway in place Eyes:     General: No scleral icterus.    Conjunctiva/sclera: Conjunctivae normal.     Comments: Pupils 5 mm, not reactive.   Neck:     Trachea: No tracheal deviation.     Comments: c-collar. Trachea midline.  Cardiovascular:     Comments: No pulses, cpr.  Pulmonary:     Effort: No accessory muscle usage.     Breath sounds: Wheezing present.     Comments: Bag ventilate via Edison Pace. bil bs. Wheezing noted.   Abdominal:     General: There is distension.     Palpations: Abdomen is soft.  Genitourinary:    Comments: No cva tenderness. Musculoskeletal:        General: No swelling or tenderness.     Right lower leg: No edema.     Left lower leg: No edema.     Comments: IO line LLE.  Skin:    General: Skin is warm and dry.     Findings: No rash.  Neurological:     Mental Status:  He is alert.     Comments: No response to stimuli. GCS 3.   Psychiatric:     Comments: Unresponsive.      ED Results / Procedures / Treatments   Labs (all labs ordered are listed, but only abnormal results are displayed) Results for orders placed or performed during the hospital encounter of 06/24/2019  CBC  Result Value Ref Range   WBC 12.3 (H) 4.0 - 10.5 K/uL   RBC 4.66 4.22 - 5.81 MIL/uL   Hemoglobin 14.2 13.0 - 17.0 g/dL   HCT 46.9 39.0 - 52.0 %   MCV 100.6 (H) 80.0 - 100.0 fL   MCH 30.5 26.0 - 34.0 pg   MCHC 30.3 30.0 - 36.0 g/dL   RDW 13.2 11.5 - 15.5 %   Platelets 262 150 - 400 K/uL   nRBC 0.2 0.0 - 0.2 %  Comprehensive metabolic panel  Result Value Ref Range   Sodium 140 135 - 145 mmol/L   Potassium 4.8 3.5 - 5.1 mmol/L   Chloride 104 98 - 111 mmol/L   CO2 15 (L) 22 - 32 mmol/L   Glucose, Bld 326 (H) 70 - 99 mg/dL   BUN 10 8 - 23 mg/dL   Creatinine, Ser 1.97 (H) 0.61 - 1.24 mg/dL   Calcium 8.5 (L) 8.9 - 10.3 mg/dL   Total Protein 5.6 (L) 6.5 - 8.1 g/dL   Albumin 3.0 (L) 3.5 - 5.0 g/dL   AST 59 (H) 15 - 41 U/L   ALT 56 (H) 0 - 44 U/L   Alkaline Phosphatase 58 38 - 126 U/L   Total Bilirubin 0.6 0.3 - 1.2 mg/dL   GFR calc non Af Amer 35 (L) >60 mL/min   GFR calc Af Amer 40 (L) >60 mL/min   Anion gap 21 (H) 5 - 15  Protime-INR  Result Value Ref Range   Prothrombin Time 15.9 (H) 11.4 - 15.2 seconds   INR 1.3 (H) 0.8 - 1.2  CBG monitoring, ED  Result Value Ref Range   Glucose-Capillary 204 (H) 70 - 99 mg/dL  Troponin I (High Sensitivity)  Result Value Ref Range   Troponin I (High  Sensitivity) 30 (H) <18 ng/L   DG Chest Port 1 View  Result Date: 07/04/2019 CLINICAL DATA:  Post arrest EXAM: PORTABLE CHEST 1 VIEW COMPARISON:  Portable exam 1941 hours compared to 05/07/2019 FINDINGS: Tip of endotracheal tube projects approximately 8.2 cm above carina. Nasogastric tube extends into abdomen. External pacing leads project over chest. Normal heart size, mediastinal contours, and pulmonary vascularity. Lungs clear. No pulmonary infiltrate, pleural effusion, or pneumothorax. Bones unremarkable. IMPRESSION: No acute abnormalities. Electronically Signed   By: Lavonia Dana M.D.   On: 06/28/2019 19:47    EKG EKG Interpretation  Date/Time:  Saturday July 10 2019 19:32:18 EST Ventricular Rate:  107 PR Interval:    QRS Duration: 174 QT Interval:  444 QTC Calculation: 593 R Axis:   -70 Text Interpretation: Junctional tachycardia Left bundle branch block Confirmed by Lajean Saver [phone]) on 07/02/2019 7:36:40 PM   Radiology CT HEAD WO CONTRAST  Result Date: 06/20/2019 CLINICAL DATA:  Brought in by EMS with CPR in progress, shortness of breath with collapse, history of CHF EXAM: CT HEAD WITHOUT CONTRAST CT CERVICAL SPINE WITHOUT CONTRAST TECHNIQUE: Multidetector CT imaging of the head and cervical spine was performed following the standard protocol without intravenous contrast. Multiplanar CT image reconstructions of the cervical spine were also generated. COMPARISON:  CT and MRI head 05/21/2011 FINDINGS:  CT HEAD FINDINGS Brain: There is global loss of the gray-white differentiation with diffuse sulcal effacement and partial effacement of the basal cisterns when compared to prior CT from 2012. No evidence of acute intracranial hemorrhage. Prominence of the falx and tentorium likely reflects a pseudo subarachnoid sign, which is a common finding in the setting of cerebral edema. Ventricles remain fairly normal caliber. No extra-axial fluid or hemorrhage. Vascular: No hyperdense vessel or  unexpected calcification. Skull: No calvarial fracture or suspicious osseous lesion. No scalp swelling or hematoma. Benign dermal calcifications are seen. Sinuses/Orbits: Thickening and secretions in the ethmoids possibly related to instrumentation with intubation at the time of exam. Chronic deformity of the nasal bones. Included orbital structures are unremarkable. Other: None CT CERVICAL SPINE FINDINGS Alignment: Preservation of the normal cervical lordosis without traumatic listhesis. No abnormal facet widening. Normal alignment of the craniocervical and atlantoaxial articulations. Skull base and vertebrae: No acute fracture. No primary bone lesion or focal pathologic process. Soft tissues and spinal canal: No pre or paravertebral fluid or swelling. No visible canal hematoma. Disc levels: Multilevel intervertebral disc height loss with spondylitic endplate changes. Disc osteophyte complex at C6-7 effaces the ventral thecal sac with at most mild canal stenosis. Uncinate spurring and facet hypertrophic changes result in mild multilevel foraminal narrowing from C5-T1. Upper chest: Patient is intubated with transesophageal and endotracheal tubes at the time of exam. Other: Gas within the subclavian veins may reflect line placement attempts or intra of venous gas from IV access. IMPRESSION: 1. CT findings worrisome for diffuse cerebral edema which may reflect a global anoxic type injury in the setting of CPR. Consider correlation with MRI following patient stabilization. 2. No acute fracture of the cervical spine. 3. Multilevel degenerative changes of the cervical spine, detailed above. 4. Gas within the subclavian veins may reflect line placement attempts or intra of venous gas from IV access. 5. Intubation noted at the time of exam. Thickening in the paranasal sinuses is likely secondary to instrumentation. Currently attempting to contact the ordering provider with a critical value result. Addendum will be  submitted upon case discussion. Electronically Signed   By: Lovena Le M.D.   On: 07/14/2019 21:28   CT CERVICAL SPINE WO CONTRAST  Result Date: 07/06/2019 CLINICAL DATA:  Brought in by EMS with CPR in progress, shortness of breath with collapse, history of CHF EXAM: CT HEAD WITHOUT CONTRAST CT CERVICAL SPINE WITHOUT CONTRAST TECHNIQUE: Multidetector CT imaging of the head and cervical spine was performed following the standard protocol without intravenous contrast. Multiplanar CT image reconstructions of the cervical spine were also generated. COMPARISON:  CT and MRI head 05/21/2011 FINDINGS: CT HEAD FINDINGS Brain: There is global loss of the gray-white differentiation with diffuse sulcal effacement and partial effacement of the basal cisterns when compared to prior CT from 2012. No evidence of acute intracranial hemorrhage. Prominence of the falx and tentorium likely reflects a pseudo subarachnoid sign, which is a common finding in the setting of cerebral edema. Ventricles remain fairly normal caliber. No extra-axial fluid or hemorrhage. Vascular: No hyperdense vessel or unexpected calcification. Skull: No calvarial fracture or suspicious osseous lesion. No scalp swelling or hematoma. Benign dermal calcifications are seen. Sinuses/Orbits: Thickening and secretions in the ethmoids possibly related to instrumentation with intubation at the time of exam. Chronic deformity of the nasal bones. Included orbital structures are unremarkable. Other: None CT CERVICAL SPINE FINDINGS Alignment: Preservation of the normal cervical lordosis without traumatic listhesis. No abnormal facet widening. Normal alignment of  the craniocervical and atlantoaxial articulations. Skull base and vertebrae: No acute fracture. No primary bone lesion or focal pathologic process. Soft tissues and spinal canal: No pre or paravertebral fluid or swelling. No visible canal hematoma. Disc levels: Multilevel intervertebral disc height loss with  spondylitic endplate changes. Disc osteophyte complex at C6-7 effaces the ventral thecal sac with at most mild canal stenosis. Uncinate spurring and facet hypertrophic changes result in mild multilevel foraminal narrowing from C5-T1. Upper chest: Patient is intubated with transesophageal and endotracheal tubes at the time of exam. Other: Gas within the subclavian veins may reflect line placement attempts or intra of venous gas from IV access. IMPRESSION: 1. CT findings worrisome for diffuse cerebral edema which may reflect a global anoxic type injury in the setting of CPR. Consider correlation with MRI following patient stabilization. 2. No acute fracture of the cervical spine. 3. Multilevel degenerative changes of the cervical spine, detailed above. 4. Gas within the subclavian veins may reflect line placement attempts or intra of venous gas from IV access. 5. Intubation noted at the time of exam. Thickening in the paranasal sinuses is likely secondary to instrumentation. Currently attempting to contact the ordering provider with a critical value result. Addendum will be submitted upon case discussion. Electronically Signed   By: Lovena Le M.D.   On: 07/08/2019 21:28   DG Chest Port 1 View  Result Date: 06/21/2019 CLINICAL DATA:  Post arrest EXAM: PORTABLE CHEST 1 VIEW COMPARISON:  Portable exam 1941 hours compared to 05/07/2019 FINDINGS: Tip of endotracheal tube projects approximately 8.2 cm above carina. Nasogastric tube extends into abdomen. External pacing leads project over chest. Normal heart size, mediastinal contours, and pulmonary vascularity. Lungs clear. No pulmonary infiltrate, pleural effusion, or pneumothorax. Bones unremarkable. IMPRESSION: No acute abnormalities. Electronically Signed   By: Lavonia Dana M.D.   On: 07/09/2019 19:47    Procedures Procedures (including critical care time)  Medications Ordered in ED Medications  ipratropium-albuterol (DUONEB) 0.5-2.5 (3) MG/3ML nebulizer  solution 3 mL (has no administration in time range)  ipratropium-albuterol (DUONEB) 0.5-2.5 (3) MG/3ML nebulizer solution (has no administration in time range)  EPINEPHrine (ADRENALIN) 1 MG/10ML injection (1 mg Intravenous Given 06/23/2019 1927)  EPINEPHrine (ADRENALIN) 1 MG/10ML injection (1 mg Intravenous Given 07/10/19 1944)    ED Course  I have reviewed the triage vital signs and the nursing notes.  Pertinent labs & imaging results that were available during my care of the patient were reviewed by me and considered in my medical decision making (see chart for details).    MDM Rules/Calculators/A&P                      CPR continued.   Resp therapy to bedside. Bag ventilate via Edison Pace in prep for change to definitive ETT.  Stat labs.  Epi. CPR. Additional EPI.   King airway removed, Glidescope used to place 7.5 ETT. Tube visualized to pass through cords, bil bs, stat pcxr.  Code cool orders placed.   Post airway change, cpr/epi - return of pulses. hco3 iv given. Ns boluses.   Strong bil fem pulses, radial pulses palp.   After ROSC for several minutes, brief loss of pulses in ED, cpr immediately started, epi iv -  Trachea midline, bil bs  - after brief cpr, return of pulses. bil vs  Cardiology consulted.  Labs reviewed/interpreted by me - K normal. Hgb normal.   Xrays reviewed/interpreted by me - ETT ok. No pna.   ETT @  25 cm and on xray is 8 cm above carina - respiratory therapy advanced tube 2 cm to 27 cm.  bil bs. sats 100%.   Cardiology reviewed ecg, and feels LBBB old and no indication for emergent cath lab - they will continue to follow.  CT imaging ordered.   Albuterol nebs for wheezing. Will also given solumedrol and Mg. Discussed with resp therapy, will also decrease rrr, increase exp time, additional nebs.   Spouse contacted - discussed pt. States today he intermittently c/o feeling sob, generally weak, but states was able to play chess on computer for 4 hours. States  at recent cardiology appt was given rx coreg, but didn't like how it made him feel, so hadnt been taking. Today, with his sob he did take a dose. Denies other med change. This evening had felt as if was wheezing, sob, and needed air, and then collapsed/arrested. No c/o cp per se. No fevers. She indicates recent covid testing had been negative, and no prior hx covid.   Additional labs reviewed/interpreted by me  - covid neg.   ICU consulted for admission.  Patient is starting to breath above vent, bite on tube. Propofol for sedation.  Discussed pt with icu attending - he will send team to ED to admit.   CTs reviewed/interpreted by me - c/w anoxic injury (likely suffered at time of arrest during which no bystander cpr, no pulses/apnea, and prolonged period CPR by EMS, 45 min)  CRITICAL CARE RE: acute respiratory failure with cardiopulmonary arrest, initial asystole/apnea with prolonged EMS CPR, anoxic brain injury, chest trauma related to prolonged CPR Performed by: Mirna Mires Total critical care time: 140 minutes Critical care time was exclusive of separately billable procedures and treating other patients. Critical care was necessary to treat or prevent imminent or life-threatening deterioration. Critical care was time spent personally by me on the following activities: development of treatment plan with patient and/or surrogate as well as nursing, discussions with consultants, evaluation of patient's response to treatment, examination of patient, obtaining history from patient or surrogate, ordering and performing treatments and interventions, ordering and review of laboratory studies, ordering and review of radiographic studies, pulse oximetry and re-evaluation of patient's condition.     Final Clinical Impression(s) / ED Diagnoses Final diagnoses:  None    Rx / DC Orders ED Discharge Orders    None       Lajean Saver, MD 06/24/2019 2148

## 2019-07-10 NOTE — ED Notes (Signed)
Ice packs placed to groin and axilla. 

## 2019-07-10 NOTE — ED Notes (Signed)
1 amp bicarb given IV per verbal order EDP.

## 2019-07-11 DIAGNOSIS — G931 Anoxic brain damage, not elsewhere classified: Secondary | ICD-10-CM | POA: Diagnosis not present

## 2019-07-11 DIAGNOSIS — I469 Cardiac arrest, cause unspecified: Secondary | ICD-10-CM

## 2019-07-11 DIAGNOSIS — J9602 Acute respiratory failure with hypercapnia: Secondary | ICD-10-CM

## 2019-07-11 DIAGNOSIS — J9601 Acute respiratory failure with hypoxia: Secondary | ICD-10-CM

## 2019-07-11 LAB — BLOOD GAS, ARTERIAL
Acid-base deficit: 3.6 mmol/L — ABNORMAL HIGH (ref 0.0–2.0)
Bicarbonate: 27.2 mmol/L (ref 20.0–28.0)
FIO2: 100
O2 Saturation: 98.7 %
Patient temperature: 34
pCO2 arterial: >120 mmHg (ref 32.0–48.0)
pH, Arterial: 7.021 — CL (ref 7.350–7.450)
pO2, Arterial: 311 mmHg — ABNORMAL HIGH (ref 83.0–108.0)

## 2019-07-11 MED ORDER — ACETAMINOPHEN 325 MG PO TABS: 650.0000 mg | ORAL_TABLET | Freq: Four times a day (QID) | ORAL | Status: DC | PRN

## 2019-07-11 MED ORDER — GLYCOPYRROLATE 0.2 MG/ML IJ SOLN
0.2000 mg | INTRAMUSCULAR | Status: DC | PRN
  Administered 2019-07-11: 0.2 mg via INTRAVENOUS
  Filled 2019-07-11: qty 1

## 2019-07-11 MED ORDER — POLYVINYL ALCOHOL 1.4 % OP SOLN: 1.0000 [drp] | Freq: Four times a day (QID) | OPHTHALMIC | Status: DC | PRN

## 2019-07-11 MED ORDER — MIDAZOLAM HCL 2 MG/2ML IJ SOLN
INTRAMUSCULAR | Status: AC
  Filled 2019-07-11: qty 2

## 2019-07-11 MED ORDER — MORPHINE BOLUS VIA INFUSION
5.0000 mg | INTRAVENOUS | Status: DC | PRN
  Filled 2019-07-11: qty 5

## 2019-07-11 MED ORDER — DIPHENHYDRAMINE HCL 50 MG/ML IJ SOLN: 25.0000 mg | INTRAMUSCULAR | Status: DC | PRN

## 2019-07-11 MED ORDER — MORPHINE SULFATE (PF) 2 MG/ML IV SOLN: 2.0000 mg | INTRAVENOUS | Status: DC | PRN

## 2019-07-11 MED ORDER — ACETAMINOPHEN 650 MG RE SUPP: 650.0000 mg | Freq: Four times a day (QID) | RECTAL | Status: DC | PRN

## 2019-07-11 MED ORDER — DEXTROSE 5 % IV SOLN: INTRAVENOUS | Status: DC

## 2019-07-11 MED ORDER — MORPHINE 100MG IN NS 100ML (1MG/ML) PREMIX INFUSION
0.0000 mg/h | INTRAVENOUS | Status: DC
  Administered 2019-07-11: 5 mg/h via INTRAVENOUS
  Filled 2019-07-11: qty 100

## 2019-07-11 MED FILL — Medication: Qty: 1 | Status: AC

## 2019-07-19 NOTE — ED Notes (Signed)
Family bedside w/ Chaplin.

## 2019-07-19 NOTE — ED Notes (Signed)
Patient time of death occurred at 3:24.

## 2019-07-19 NOTE — ED Notes (Signed)
Extubation complete, family return to be with patient.

## 2019-07-19 NOTE — Progress Notes (Signed)
Chaplain visited with the patient and offered supportive conversation.  The family displayed great strength in coping with loss.  The chaplain was present throughout the transition to comfort care.  Brion Aliment Chaplain Resident For questions concerning this note please contact me by pager [phone]

## 2019-07-19 NOTE — ED Provider Notes (Signed)
Alerted by nursing staff that patient was asystolic on monitor.  Evaluation reveals patient is pulseless, apneic, no cardiac activity with auscultation.  Patient declared dead at 03:24.  Family at bedside.   Orpah Greek, MD 08/02/2019 [phone]

## 2019-07-19 NOTE — Procedures (Signed)
Extubation Procedure Note  Patient Details:   Name: Michael Love DOB: May 12, 1956 MRN: [id number]   Airway Documentation:  Airway 7.5 mm (Active)  Secured at (cm) 28 cm 07/16/2019 0026  Measured From Lips 16-Jul-2019 0026  Secured Location Right 07-16-19 0026  Secured By [employer] July 16, 2019 0026  Site Condition Dry 07/16/19 0026   Vent end date: (not recorded) Vent end time: (not recorded)   Evaluation  O2 sats: stable throughout Complications: No apparent complications Patient did tolerate procedure well. Bilateral Breath Sounds: Expiratory wheezes   Yes  Extubated to terminal wean.  Levora Dredge 07-16-2019, 3:15 AM

## 2019-07-19 NOTE — Consult Note (Signed)
NAME:  KORDEL ARRIOLA, MRN:  [id number], DOB:  18-Mar-1956, LOS: 0 ADMISSION DATE:  07/07/2019, CONSULTATION DATE:  07-26-2019 REFERRING MD:  Ashok Cordia, CHIEF COMPLAINT:  Cardiac arrest   Brief History   64 year old male with history of heart failure and COPD who had been feeling fatigued and short of breath all day yesterday and went outside to get some air where he collapsed.  Asystolic without bystander CPR on EMS arrival and underwent proximately 45 minutes of resuscitative efforts before ROSC achieved.  Patient also coded again in route to the emergency department.  Now intubated with anoxic changes on head CT.  PCCM consulted for admission  History of present illness   Mr. Hubbart is a 64 year old male with a history of HFrEF, COPD, hypothyroidism and Lyme disease who was short of breath and fatigued all day and then collapsed at home.  EMS found patient in PEA arrest and perform 45 minutes of CPR before rhythm changed to V. tach/V. fib and ROSC was achieved with defibrillation.  Patient lost pulses again approximately 10 minutes later and arrived to the emergency department with CPR in progress.  He received approximately 10 mg of epi prior to arrival and then ROSC achieved after approximately 35 additional minutes of CPR and epi x2.  King airway exchanged for ETT.    Further work-up in the ED revealed negative Covid-19, EKG with old LBBB and no STEMI, AKI, high sensitivity troponin 30 and head CT with iffuse cerebral edema which may reflect a global anoxic type injury and diffuse sulcal effacement and partial effacement of the basal cisterns.   Past Medical History   has a past medical history of Acute respiratory failure with hypoxia ([place]) (04/2019), Asthma, CHF (congestive heart failure) ([place]), COPD (chronic obstructive pulmonary disease) ([place]), Difficult intubation (2012), Hyperthyroidism (20 years), Incarcerated inguinal hernia (05/19/2011), Lyme disease, and Panic disorder  (02/19/2018).   Significant Hospital Events   1/23 presented to emergency department  Consults:  PCCM  Procedures:  1/23 ET tube  Significant Diagnostic Tests:  1/23 CT head>>global loss of the gray-white differentiation with diffuse sulcal effacement and partial effacement of the basal cisterns, no [facility] 1/23 CTA chest>>No PE, mediastinal stranding and hemorrhage compatible with a retrosternal mediastinal hematoma likely related to the nondisplaced sternal fractures in the setting of CPR  Micro Data:  1/23 Sars-CoV-2 and influenza>>negative  Antimicrobials:     Interim history/subjective:  Pt intubated and ROSC achieved again in the ED, not a cooling candidate, Dr. Gilford Raid spoke with patient's wife who is coming to the ED and expects to pursue comfort measures.  Pt was biting on ETT, so propofol started  Objective   Blood pressure (!) 136/91, pulse (!) 110, temperature (!) 94.9 F (34.9 C), resp. rate (!) 31, height 5\' 10"  (1.778 m), weight 113.4 kg, SpO2 99 %.    Vent Mode: PRVC FiO2 (%):  [40 %-100 %] 40 % Set Rate:  [18 bmp] 18 bmp Vt Set:  [580 mL] 580 mL PEEP:  [5 cmH20] 5 cmH20   Intake/Output Summary (Last 24 hours) at July 26, 2019 0050 Last data filed at 07/09/2019 1948 Gross per 24 hour  Intake --  Output 200 ml  Net -200 ml   Filed Weights   07/10/2019 1933  Weight: 113.4 kg    General: Overweight male, sedated and intubated HEENT: MM pink/moist, ETT in place Neuro: Not withdrawing to pain, pupils 3 mm bilaterally and unresponsive, no corneal response, slight, nonpurposeful movement of the right arm and  patient is breathing over the vent CV: s1s2 RRR, no m/r/g PULM: Significant wheezing in all lung fields bilaterally GI: soft, bsx4 active  Extremities: warm/dry, no edema  Skin: no rashes or lesions  Resolved Hospital Problem list     Assessment & Plan:   PEA cardiac arrest status post prolonged CPR  -Not a cooling candidate secondary to  downtime -Evidence of anoxic brain injury on CT with poor neurologic exam P: -Family coming to the emergency department for goals of care discussion at the bedside, wife expressed desire for comfort measures   Best practice:  Diet: N.p.o. Pain/Anxiety/Delirium protocol (if indicated): Propofol VAP protocol (if indicated): Yes DVT prophylaxis: We will order if admitted GI prophylaxis: TBD Glucose control: TBD Mobility: Bedrest Code Status: DNR Family Communication: Dr. Gilford Raid, attending ICU physician spoke with patient's wife Disposition: TBD  Labs   CBC: Recent Labs  Lab 07/14/2019 1933  WBC 12.3*  HGB 14.2  HCT 46.9  MCV 100.6*  PLT 99991111    Basic Metabolic Panel: Recent Labs  Lab 07/03/2019 1933  NA 140  K 4.8  CL 104  CO2 15*  GLUCOSE 326*  BUN 10  CREATININE 1.97*  CALCIUM 8.5*   GFR: Estimated Creatinine Clearance: 47.8 mL/min (A) (by C-G formula based on SCr of 1.97 mg/dL (H)). Recent Labs  Lab 07/17/2019 1933  WBC 12.3*    Liver Function Tests: Recent Labs  Lab 06/27/2019 1933  AST 59*  ALT 56*  ALKPHOS 58  BILITOT 0.6  PROT 5.6*  ALBUMIN 3.0*   No results for input(s): LIPASE, AMYLASE in the last 168 hours. No results for input(s): AMMONIA in the last 168 hours.  ABG    Component Value Date/Time   PHART 7.021 (LL) 07/14/2019 2130   PCO2ART >120.0 (HH) 06/24/2019 2130   PO2ART PENDING 06/30/2019 2130   HCO3 27.2 07/09/2019 2130   TCO2 27 05/07/2019 0830   ACIDBASEDEF 3.6 (H) 06/24/2019 2130   O2SAT 98.7 07/12/2019 2130     Coagulation Profile: Recent Labs  Lab 06/18/2019 1933  INR 1.3*    Cardiac Enzymes: No results for input(s): CKTOTAL, CKMB, CKMBINDEX, TROPONINI in the last 168 hours.  HbA1C: No results found for: HGBA1C  CBG: Recent Labs  Lab 07/10/19 1949  GLUCAP 204*    Review of Systems:   Unable to obtain  Past Medical History  He,  has a past medical history of Acute respiratory failure with hypoxia ([place])  (04/2019), Asthma, CHF (congestive heart failure) ([place]), COPD (chronic obstructive pulmonary disease) ([place]), Difficult intubation (2012), Hyperthyroidism (20 years), Incarcerated inguinal hernia (05/19/2011), Lyme disease, and Panic disorder (02/19/2018).   Surgical History    Past Surgical History:  Procedure Laterality Date  . CYST REMOVAL HAND Left    index finger left hand  . HERNIA REPAIR  1982   repaired hernia in upper abd  . INGUINAL HERNIA REPAIR  05/21/2011   Procedure: HERNIA REPAIR INGUINAL ADULT;  Surgeon: Stark Klein, MD;  Location: [facility] ORS;  Service: General;  Laterality: Right;  . LAPAROSCOPIC RIGHT HEMI COLECTOMY Right 08/18/2017   Procedure: LAPAROSCOPIC RIGHT HEMI COLECTOMY;  Surgeon: Herbert Pun, MD;  Location: [facility] ORS;  Service: General;  Laterality: Right;  . ROTATOR CUFF REPAIR Right 2009   Dr Latanya Maudlin  . TONSILLECTOMY       Social History   reports that he has quit smoking. His smoking use included cigarettes. He has never used smokeless tobacco. He reports that he does not drink alcohol or  use drugs.   Family History   His family history includes Cancer in his brother and father; Colon cancer in his father; Diabetes in his mother.   Allergies No Known Allergies   Home Medications  Prior to Admission medications   Medication Sig Start Date End Date Taking? Authorizing Provider  albuterol (VENTOLIN HFA) 108 (90 Base) MCG/ACT inhaler Inhale 2 puffs into the lungs every 4 (four) hours as needed for wheezing or shortness of breath.  04/16/19   [provider]  BEE POLLEN PO Take 1 capsule by mouth every other day.    [provider]  clonazePAM (KLONOPIN) 1 MG tablet Take 0.5 mg by mouth 2 (two) times daily.  05/06/17   [provider]  fluticasone (FLONASE) 50 MCG/ACT nasal spray Place 2 sprays into both nostrils daily. 05/11/19   Hosie Poisson, MD  furosemide (LASIX) 40 MG tablet Take 1 tablet (40 mg total) by mouth daily.  05/11/19   Hosie Poisson, MD  ipratropium-albuterol (DUONEB) 0.5-2.5 (3) MG/3ML SOLN Take 3 mLs by nebulization 3 (three) times daily. 05/10/19   Hosie Poisson, MD  losartan (COZAAR) 25 MG tablet Take 1 tablet (25 mg total) by mouth daily. 05/11/19   Hosie Poisson, MD  OVER THE COUNTER MEDICATION Take 1 packet by mouth every other day. OTC packet of vegie and fruit tablets    [provider]  pantoprazole (PROTONIX) 40 MG tablet Take 1 tablet (40 mg total) by mouth daily. 05/11/19   Hosie Poisson, MD  predniSONE (DELTASONE) 20 MG tablet Prednisone 40 mg daily for 2 days followed by  Prednisone 20 mg daily for 3 days. 05/10/19   Hosie Poisson, MD  Pseudoeph-CPM-DM-APAP (SEVERE COLD/FLU PO) Take 1 capsule by mouth every 6 (six) hours as needed (cough/cold symptoms).    [provider]  ROYAL JELLY PO Take 1 capsule by mouth every other day.    [provider]     Critical care time: 30 minutes      CRITICAL CARE Performed by: Otilio Carpen Shanti Agresti   Total critical care time: 30 minutes  Critical care time was exclusive of separately billable procedures and treating other patients.  Critical care was necessary to treat or prevent imminent or life-threatening deterioration.  Critical care was time spent personally by me on the following activities: development of treatment plan with patient and/or surrogate as well as nursing, discussions with consultants, evaluation of patient's response to treatment, examination of patient, obtaining history from patient or surrogate, ordering and performing treatments and interventions, ordering and review of laboratory studies, ordering and review of radiographic studies, pulse oximetry and re-evaluation of patient's condition.   Otilio Carpen Lenix Benoist, PA-C

## 2019-07-19 DEATH — deceased

## 2021-03-08 IMAGING — CT CT ANGIO CHEST
2 of 6 series · 17 of 36 positions shown · IV contrast (omnipaque)
Comparison: Chest radiograph 07/10/2019, CT chest 05/06/2020

CLINICAL DATA: Post CPR, patient collapsed following shortness of
breath

EXAM:
CT ANGIOGRAPHY CHEST WITH CONTRAST
TECHNIQUE: Multidetector CT imaging of the chest was performed using the
standard protocol during bolus administration of intravenous
contrast. Multiplanar CT image reconstructions and MIPs were
obtained to evaluate the vascular anatomy.
CONTRAST:  100mL OMNIPAQUE IOHEXOL 350 MG/ML SOLN

[Series 7: pe thins · axial · 0.98mm/px · z∈[+435,+778]mm · 16 of 545 slices shown]
[im 28/545  lung]
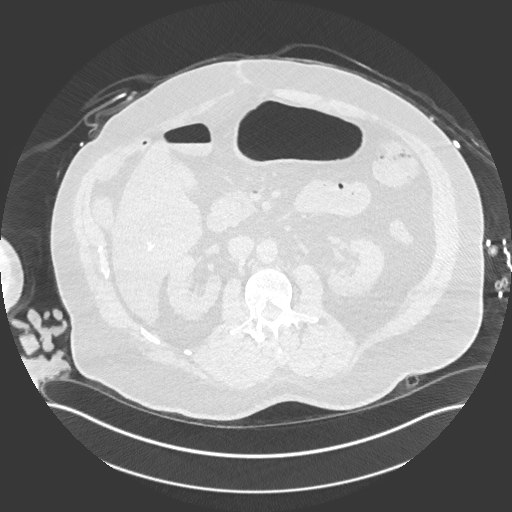
[im 55/545  mediastinal]
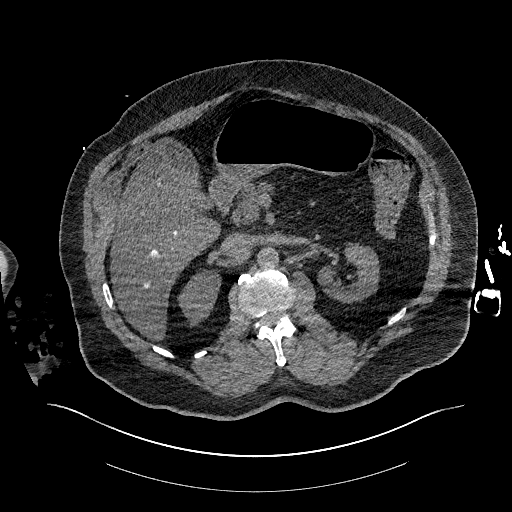
[im 82/545  lung]
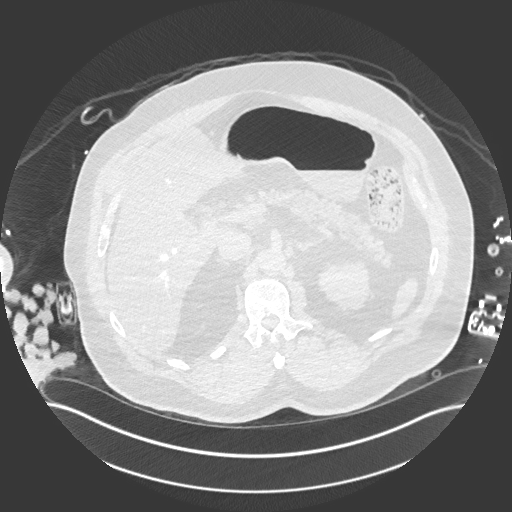
[im 137/545  mediastinal]
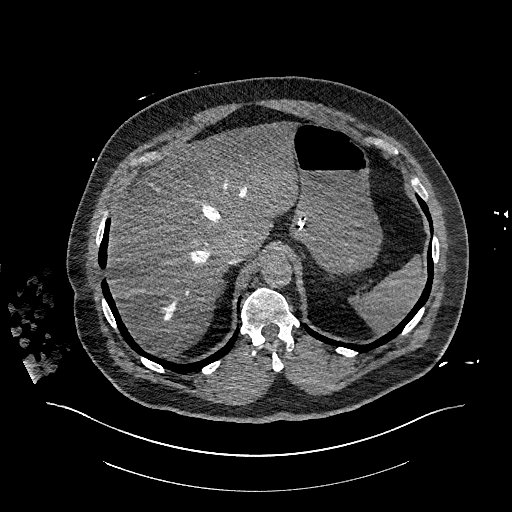
[im 164/545  lung]
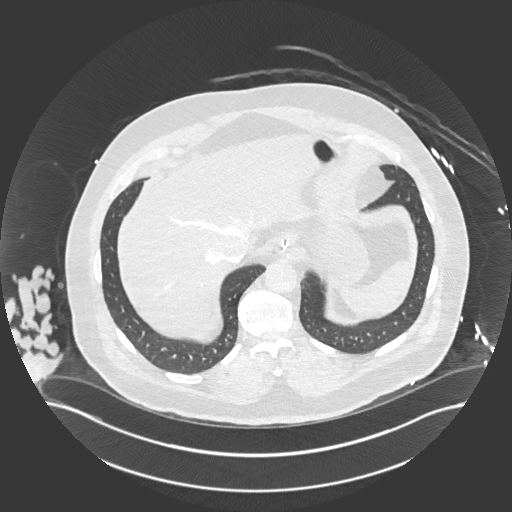
[im 191/545  mediastinal]
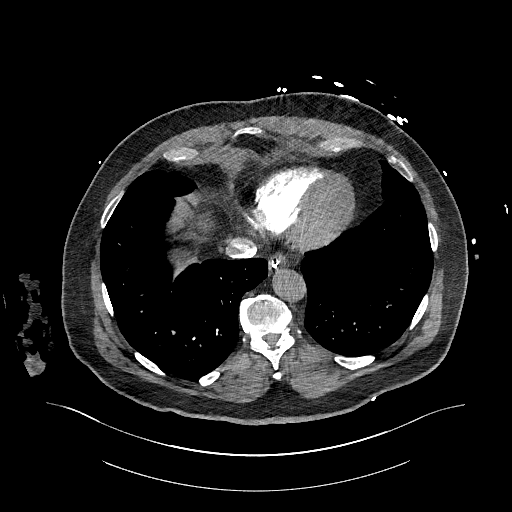
[im 218/545  lung]
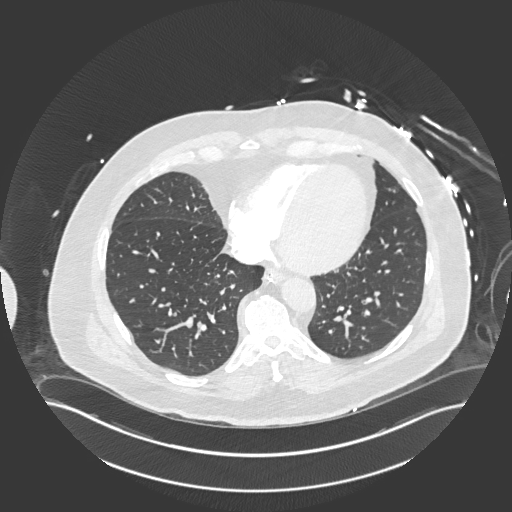
[im 245/545  mediastinal]
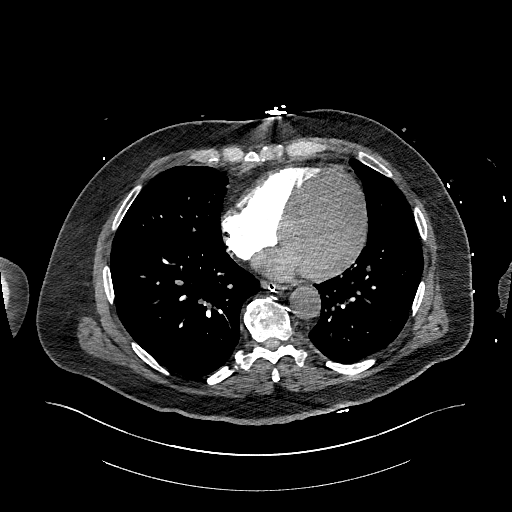
[im 300/545  lung]
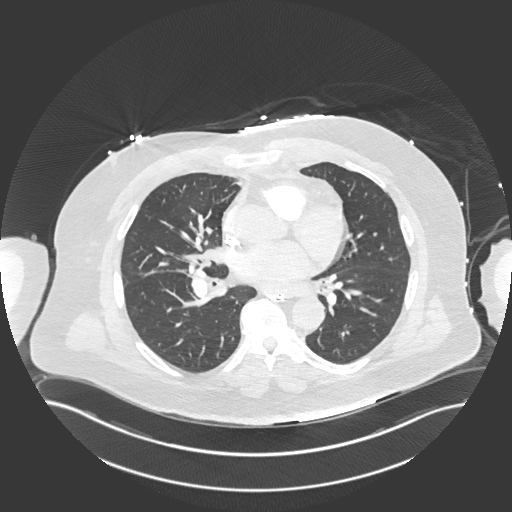
[im 327/545  mediastinal]
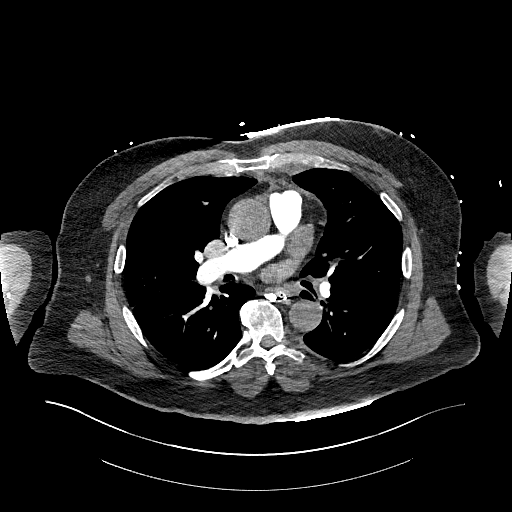
[im 354/545  lung]
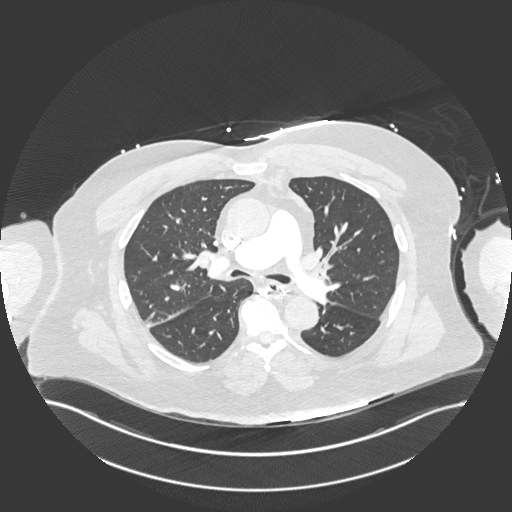
[im 381/545  mediastinal]
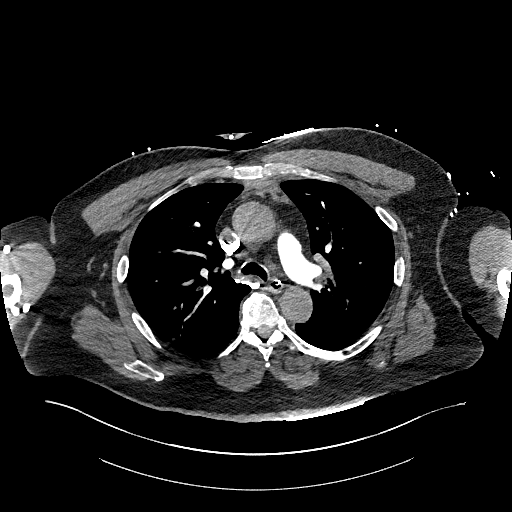
[im 409/545  lung]
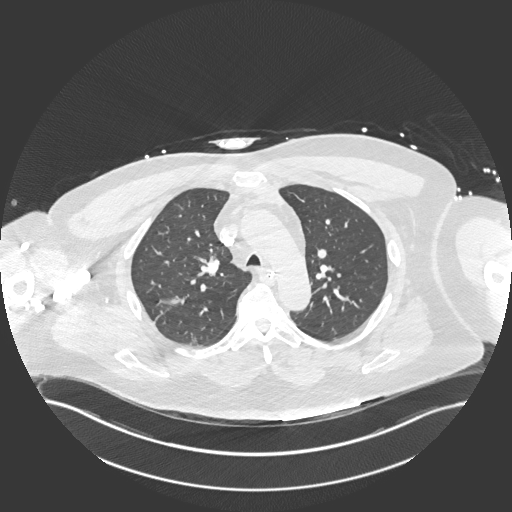
[im 463/545  mediastinal]
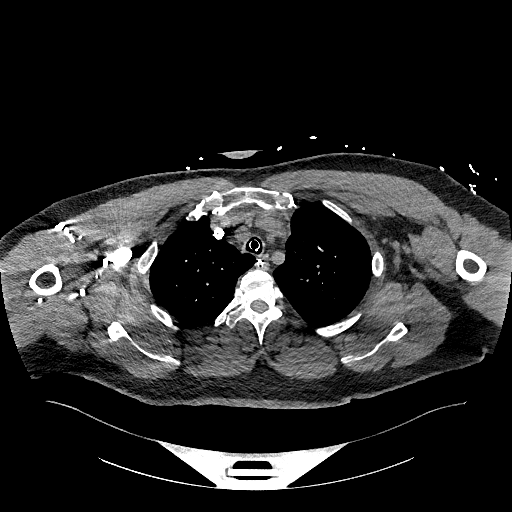
[im 490/545  lung]
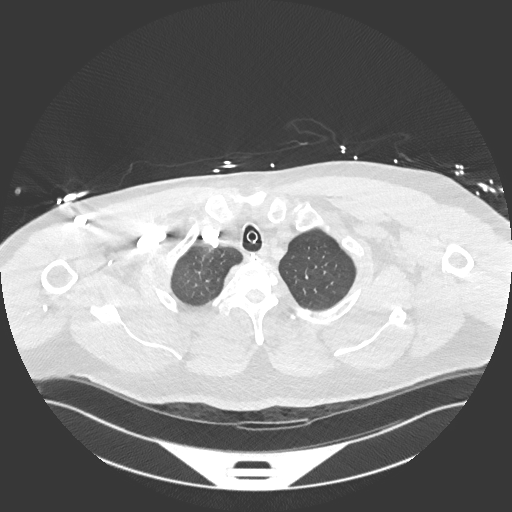
[im 517/545  mediastinal]
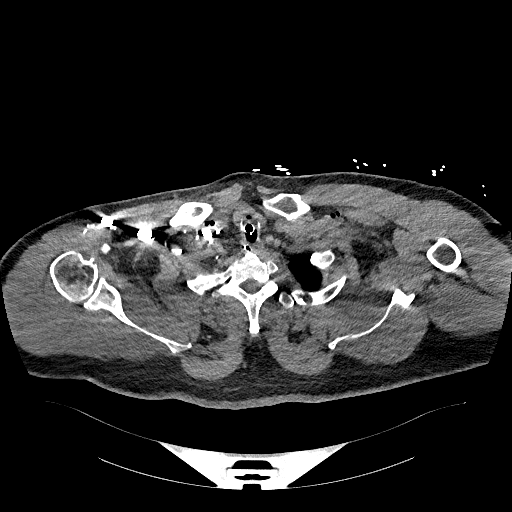

[Series 8: pe 2mm cor · coronal · 0.75mm/px · 1 of 162 slices shown]
[im 81/162  mediastinal]
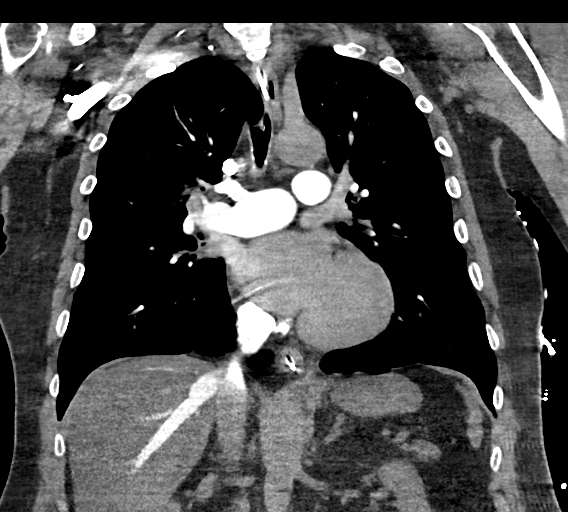

[17 of 36 positions shown; findings below may reference images not displayed]

FINDINGS: Cardiovascular: Satisfactory opacification the pulmonary arteries to
the segmental level. No pulmonary artery filling defects are
identified. Central pulmonary arteries are normal caliber. Foci of
gas within the subclavian veins and pulmonary trunk may reflect
iatrogenic air related to line placement attempts or gas injected
via intravenous access. Central pulmonary artery enlargement is
similar to comparison. Reflux of contrast into the hepatic veins may
reflect elevated right heart pressures.

Cardiac size is at the upper limits of normal. Fluid seen tracking
along the anterior pericardium, better detailed below. No gross
pericardial effusion is seen however.

Mediastinum/Nodes: There is mediastinal stranding and hemorrhage
compatible with a retrosternal mediastinal hematoma likely related
to the nondisplaced sternal fractures in the setting of CPR. No
pneumomediastinum is seen. No acute traumatic findings of the
trachea or esophagus. An endotracheal tube terminates appropriately
in the mid trachea 4 cm from the carina. A transesophageal tube is
in high position with the side port at the GE junction and should be
advanced 3-5 cm for optimal functioning. Thyroid gland is
unremarkable

Lungs/Pleura: Bandlike areas of atelectasis in the lungs. No
consolidation, features of edema, pneumothorax, or effusion. No
suspicious pulmonary nodules or masses.

Upper Abdomen: Reflux contrast within the hepatic veins. Partial
fatty replacement of the pancreas. Mild bilateral symmetric
perinephric stranding, a nonspecific finding though may correlate
with either age or decreased renal function. No acute abnormalities
present in the visualized portions of the upper abdomen.
Interposition of the colon anterior to the liver, an incidental
finding.

Musculoskeletal: There is a nondisplaced fracture of the upper third
sternum (8/39) as well as nondisplaced fractures of the left sixth
and seventh costal cartilages ([DATE]). Associated mediastinal
hemorrhage is detailed above. No other acute osseous injuries.
Multilevel degenerative changes are present in the imaged portions
of the spine. Multilevel flowing anterior osteophytosis, compatible
with features of diffuse idiopathic skeletal hyperostosis (DISH).

Review of the MIP images confirms the above findings.
IMPRESSION: 1. No acute pulmonary artery filling defects to suggest acute
pulmonary embolism.
2. Nondisplaced fractures of the upper third sternum and left sixth
and seventh costal cartilages. Associated mediastinal hemorrhage. No
pneumomediastinum.
3. Malpositioned transesophageal tube with the side port at the GE
junction and should be advanced 3-5 cm for optimal functioning.
4. Central pulmonary artery enlargement is similar to comparison
study. Reflux of contrast into the hepatic veins may reflect
elevated right heart pressures.
5. Foci of gas within the subclavian veins and pulmonary trunk may
reflect iatrogenic air related to line placement attempts or gas
injected via intravenous access.

Critical Value/emergent results were called by telephone at the time
of interpretation on 07/10/2019 at [DATE] to Jainap Onan ,
who verbally acknowledged these results.
